# Patient Record
Sex: Male | Born: 1955 | Race: White | Hispanic: No | Smoking: Former smoker
Health system: Southern US, Community
[De-identification: ages and names within clinical notes are randomized; demographics above are authoritative.]

## PROBLEM LIST (undated history)

## (undated) DIAGNOSIS — D510 Vitamin B12 deficiency anemia due to intrinsic factor deficiency: Secondary | ICD-10-CM

## (undated) DIAGNOSIS — K5090 Crohn's disease, unspecified, without complications: Secondary | ICD-10-CM

## (undated) DIAGNOSIS — Z9989 Dependence on other enabling machines and devices: Principal | ICD-10-CM

## (undated) DIAGNOSIS — G4733 Obstructive sleep apnea (adult) (pediatric): Secondary | ICD-10-CM

## (undated) HISTORY — DX: Obstructive sleep apnea (adult) (pediatric): G47.33

## (undated) HISTORY — DX: Dependence on other enabling machines and devices: Z99.89

## (undated) HISTORY — DX: Vitamin B12 deficiency anemia due to intrinsic factor deficiency: D51.0

## (undated) HISTORY — DX: Crohn's disease, unspecified, without complications: K50.90

## (undated) HISTORY — PX: BOWEL RESECTION: SHX1257

---

## 1997-09-24 ENCOUNTER — Ambulatory Visit (HOSPITAL_COMMUNITY): Admission: RE | Admit: 1997-09-24 | Discharge: 1997-09-24 | Payer: Self-pay | Admitting: Gastroenterology

## 1997-12-22 ENCOUNTER — Encounter (HOSPITAL_COMMUNITY): Admission: RE | Admit: 1997-12-22 | Discharge: 1998-03-22 | Payer: Self-pay | Admitting: Gastroenterology

## 1998-07-06 ENCOUNTER — Ambulatory Visit (HOSPITAL_COMMUNITY): Admission: RE | Admit: 1998-07-06 | Discharge: 1998-07-06 | Payer: Self-pay | Admitting: Gastroenterology

## 1998-10-22 ENCOUNTER — Encounter (HOSPITAL_COMMUNITY): Admission: RE | Admit: 1998-10-22 | Discharge: 1999-01-20 | Payer: Self-pay | Admitting: Gastroenterology

## 1999-09-06 ENCOUNTER — Encounter: Admission: RE | Admit: 1999-09-06 | Discharge: 1999-09-06 | Payer: Self-pay | Admitting: Gastroenterology

## 1999-09-06 ENCOUNTER — Encounter: Payer: Self-pay | Admitting: Gastroenterology

## 1999-09-08 ENCOUNTER — Encounter (HOSPITAL_COMMUNITY): Admission: RE | Admit: 1999-09-08 | Discharge: 1999-12-07 | Payer: Self-pay | Admitting: Gastroenterology

## 1999-11-02 ENCOUNTER — Ambulatory Visit (HOSPITAL_COMMUNITY): Admission: RE | Admit: 1999-11-02 | Discharge: 1999-11-02 | Payer: Self-pay | Admitting: Gastroenterology

## 1999-11-02 ENCOUNTER — Encounter: Payer: Self-pay | Admitting: Gastroenterology

## 1999-11-24 ENCOUNTER — Ambulatory Visit: Admission: RE | Admit: 1999-11-24 | Discharge: 1999-11-24 | Payer: Self-pay | Admitting: Pulmonary Disease

## 1999-12-30 ENCOUNTER — Ambulatory Visit: Admission: RE | Admit: 1999-12-30 | Discharge: 1999-12-30 | Payer: Self-pay | Admitting: Pulmonary Disease

## 2001-05-14 ENCOUNTER — Encounter (INDEPENDENT_AMBULATORY_CARE_PROVIDER_SITE_OTHER): Payer: Self-pay | Admitting: *Deleted

## 2001-05-14 ENCOUNTER — Ambulatory Visit (HOSPITAL_COMMUNITY): Admission: RE | Admit: 2001-05-14 | Discharge: 2001-05-14 | Payer: Self-pay | Admitting: Gastroenterology

## 2003-06-10 ENCOUNTER — Encounter: Admission: RE | Admit: 2003-06-10 | Discharge: 2003-06-10 | Payer: Self-pay | Admitting: Gastroenterology

## 2008-05-22 ENCOUNTER — Observation Stay (HOSPITAL_COMMUNITY): Admission: EM | Admit: 2008-05-22 | Discharge: 2008-05-24 | Payer: Self-pay | Admitting: Emergency Medicine

## 2008-05-29 ENCOUNTER — Encounter: Admission: RE | Admit: 2008-05-29 | Discharge: 2008-05-29 | Payer: Self-pay | Admitting: Gastroenterology

## 2010-05-12 LAB — CBC
HCT: 35.2 % — ABNORMAL LOW (ref 39.0–52.0)
HCT: 36.5 % — ABNORMAL LOW (ref 39.0–52.0)
HCT: 41.1 % (ref 39.0–52.0)
Hemoglobin: 13.1 g/dL (ref 13.0–17.0)
Hemoglobin: 14.4 g/dL (ref 13.0–17.0)
MCHC: 35.9 g/dL (ref 30.0–36.0)
MCV: 100.2 fL — ABNORMAL HIGH (ref 78.0–100.0)
MCV: 99 fL (ref 78.0–100.0)
Platelets: 151 10*3/uL (ref 150–400)
Platelets: 157 10*3/uL (ref 150–400)
Platelets: 206 10*3/uL (ref 150–400)
RDW: 14.5 % (ref 11.5–15.5)
RDW: 14.7 % (ref 11.5–15.5)
RDW: 14.8 % (ref 11.5–15.5)
WBC: 5 10*3/uL (ref 4.0–10.5)
WBC: 5.6 10*3/uL (ref 4.0–10.5)
WBC: 8 10*3/uL (ref 4.0–10.5)

## 2010-05-12 LAB — BASIC METABOLIC PANEL
BUN: 6 mg/dL (ref 6–23)
BUN: 9 mg/dL (ref 6–23)
CO2: 27 mEq/L (ref 19–32)
Calcium: 8 mg/dL — ABNORMAL LOW (ref 8.4–10.5)
Creatinine, Ser: 0.82 mg/dL (ref 0.4–1.5)
GFR calc non Af Amer: >60 mL/min (ref >60)
GFR calc non Af Amer: >60 mL/min (ref >60)
Glucose, Bld: 108 mg/dL — ABNORMAL HIGH (ref 70–99)
Glucose, Bld: 108 mg/dL — ABNORMAL HIGH (ref 70–99)
Potassium: 3.7 mEq/L (ref 3.5–5.1)
Sodium: 136 mEq/L (ref 135–145)

## 2010-05-12 LAB — DIFFERENTIAL
Basophils Absolute: 0 10*3/uL (ref 0.0–0.1)
Basophils Relative: 0 % (ref 0–1)
Eosinophils Absolute: 0 10*3/uL (ref 0.0–0.7)
Eosinophils Relative: 0 % (ref 0–5)
Lymphocytes Relative: 6 % — ABNORMAL LOW (ref 12–46)
Lymphs Abs: 0.5 10*3/uL — ABNORMAL LOW (ref 0.7–4.0)
Monocytes Absolute: 0.6 10*3/uL (ref 0.1–1.0)
Monocytes Absolute: 0.7 10*3/uL (ref 0.1–1.0)
Monocytes Relative: 10 % (ref 3–12)
Monocytes Relative: 9 % (ref 3–12)
Neutro Abs: 4.5 10*3/uL (ref 1.7–7.7)
Neutrophils Relative %: 80 % — ABNORMAL HIGH (ref 43–77)

## 2010-05-12 LAB — COMPREHENSIVE METABOLIC PANEL
ALT: 18 U/L (ref 0–53)
AST: 27 U/L (ref 0–37)
Albumin: 3.4 g/dL — ABNORMAL LOW (ref 3.5–5.2)
Albumin: 3.6 g/dL (ref 3.5–5.2)
Alkaline Phosphatase: 75 U/L (ref 39–117)
BUN: 15 mg/dL (ref 6–23)
Calcium: 9 mg/dL (ref 8.4–10.5)
Chloride: 105 mEq/L (ref 96–112)
Chloride: 106 mEq/L (ref 96–112)
Creatinine, Ser: 0.93 mg/dL (ref 0.4–1.5)
GFR calc Af Amer: >60 mL/min (ref >60)
Glucose, Bld: 103 mg/dL — ABNORMAL HIGH (ref 70–99)
Potassium: 4 mEq/L (ref 3.5–5.1)
Sodium: 139 mEq/L (ref 135–145)
Total Bilirubin: 1 mg/dL (ref 0.3–1.2)
Total Protein: 6.5 g/dL (ref 6.0–8.3)

## 2010-05-12 LAB — PROTIME-INR
INR: 1 (ref 0.00–1.49)
Prothrombin Time: 12.9 seconds (ref 11.6–15.2)
Prothrombin Time: 13.7 seconds (ref 11.6–15.2)

## 2010-06-15 NOTE — Discharge Summary (Signed)
NAMECRIST, KRUSZKA          ACCOUNT NO.:  [id number]   MEDICAL RECORD NO.:  [id number]          PATIENT TYPE:  OBV   LOCATION:  5532                         FACILITY:  [facility]   PHYSICIAN:  John C. Madilyn Fireman, M.D.    DATE OF BIRTH:  [birth year]   DATE OF ADMISSION:  05/22/2008  DATE OF DISCHARGE:  05/24/2008                               DISCHARGE SUMMARY   ADMIT DIAGNOSES:  Abdominal pain, distention, vomiting.   DISCHARGE DIAGNOSES:  Ileus versus partial bowel obstruction, and the  rest of his discharge diagnoses were consistent with his past medical  history.  They include Crohn disease, history of bowel obstruction and  resection x2, history of pulmonary embolus x2 in 1976, chronic diarrhea,  anal stenosis, perirectal abscess that was drained in 2000.   CONSULTATIONS:  None.   PROCEDURES:  None.   PERTINENT RADIOLOGICAL EXAMINATION:  On May 23, 2008, the patient had  an abdominal x-ray that showed no high-grade obstruction, but he did  have potential mucosal thickening through the splenic flexure of the  colon and gaseous distention with no gas in his rectum.   PERTINENT LABORATORY DATA:  On the morning of discharge, his hemoglobin  was 12.5, hematocrit 35.2, white count 5.0, platelets 151,000.  His BMET  was within normal limits other than a potassium of 3.4.  on May 22, 2008, date of admission, he had an ESR of 10.   HISTORY AND BRIEF HOSPITAL COURSE:  Mr. Breden is a very pleasant 55-  year-old gentleman who has been on long-term patient of Dr. Molly Maduro  Buccini for many years.  He has a history of Crohn disease.  On the  evening of May 21, 2008, he had severe waves of abdominal pain,  bloating with urge to defecate without results that started after eating  dinner.  His pain became so severe that Dr. Matthias Hughs recommended he come  to the [facility] for admission and observation on May 22, 2008.  At  the time of admission, Mr. Clock told me that he normally  has 6-8  semisolid stools a day without any blood; however, he had had no stools  for the past 24 hours but did have the urge to defecate.  Interestingly,  he had a colonoscopy by Dr. Matthias Hughs just 6 weeks prior that was  completely normal with no changes of Crohn disease.  He was admitted,  placed on IV fluids, as well as sips and chips, given pain medication  and Imuran, which is his normal at home Crohn's maintenance medications.  He had an x-ray of his abdomen that showed no bowel obstruction.  With  conservative management, we were able to very slowly advance his diet.  On Friday, he told me that he was still having pain requiring pain  medications on a regular basis.  He had 1 small stool.  His bowel sounds  were distinctly tympanic, and his abdomen was still distended.  We kept  him overnight 1 more night, and he went home on Saturday morning,  feeling much improved after having multiple bowel movements.   He was discharged to home on  the following medications.  He was written  a prescription for prednisone 40 mg a day x7 days or until seen by Dr.  Matthias Hughs.  The rest of his medications were consistent with his  preadmission medications and include:  1. Hydrocortisone topical 2.5% cream.  2. Ambien 5 mg p.r.n. nightly.  3. Imuran 50 mg 4 tablets daily q.a.m.  4. Prilosec 20 mg 1 tablet daily q.a.m.  5. Imodium 4 tablets daily p.r.n. diarrhea.  6. Testosterone 5 mg patch change daily.  7. Welchol 625 mg 3 tablets daily.  8. Vitamin B12 injections twice a month.  9. Align probiotics tablets.  10.Calcium 1 tablet.  11.Potassium 20 mEq 1 tablet daily.  12.Multivitamin tablets 1 tablet daily.   DISCHARGE INSTRUCTIONS:  Advancing diet slowly.  To call our office with  any increase in pain, fever, or vomiting.  He was also instructed to  please see Dr. Matthias Hughs at the [facility] at 8:30 on Monday  morning.      Stephani Police, PA    ______________________________   Everardo All Madilyn Fireman, M.D.    MLY/MEDQ  D:  05/27/2008  T:  05/27/2008  Job:  [id number]   cc:   Bernette Redbird, M.D.

## 2010-06-15 NOTE — H&P (Signed)
NAMECAYLOR, Scott [name]          ACCOUNT NO.:  [id number]   MEDICAL RECORD NO.:  [id number]          PATIENT TYPE:  OBV   LOCATION:  5532                         FACILITY:  [facility]   PHYSICIAN:  John C. Madilyn Fireman, M.D.    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  He is also a long-term patient of Dr. Molly Maduro Buccini with a history of  Crohn disease requiring 2 resections for bowel obstructions in the past.   The patient reports symptoms of waves of pain, bloating, and the urge to  defecate without results that started last night after eating dinner  that consisted of a Reuben sandwich and onion ring.  His pain is  described as severe and this gentlemen is reported to be a man with a  high pain tolerance.  He tells me the pain comes in waves and is located  in the center of his abdomen.  The patient normally has 6-8 semisolid  stools a day without any blood.  Prior to last night his stools were  normal.  Last night, his stools became watery and light.  The patient  felt feverish with waves of pain.  He has had no recent illness.  Interestingly, he had a normal colonoscopy 2-6 weeks ago by Dr. Matthias Hughs.  He describes no arthralgias, erythematous nodules.  No changes in vision  or other apparent extraintestinal manifestations or problems.   PAST MEDICAL HISTORY:  Significant for Crohn disease, recurrent  obstructions prior to his right hemicolectomy with GI resection in 1976.  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  He has had no bowel obstruction or obstructive  symptoms since 1999.  He does have a history of pulmonary embolism x2  that was in 1976.  He was placed on Coumadin, but had GI bleeding with  it and no longer takes any anticoagulation.  He had a perirectal abscess  that was  drained in 2000.  He has got a history of chronic diarrhea that  has been controlled with Imodium.  He also has a history of anal  stenosis.   REVIEW OF SYSTEMS:  As per HPI also includes no recent weight loss.  No  dizziness.  No heart palpitations.   FAMILY HISTORY:  Negative for colon cancer and colon polyps.   SOCIAL HISTORY:  Negative for tobacco.  Negative for drugs.  He drinks  approximately 2 alcoholic beverages a week.  He is married.  He has 4  children.   PHYSICAL EXAMINATION:  GENERAL:  He is alert and oriented in no apparent  distress.  He appears well.  VITAL SIGNS:  Temperature 97, pulse 71, respirations 15, and blood  pressure is 94/58.  HEART:  Regular rate and rhythm with no obvious murmurs, rubs, or  gallops.  ABDOMEN:  Soft, tender on the right side and tender on the right lower  quadrant, quiet to auscultation.  LOWER EXTREMITY:  No edema.   LABORATORY DATA:  Pending.  X-ray is pending.   ASSESSMENT:  Crohn disease, possible bowel obstruction.  We will admit  for observation.  I gave IV fluids, pain control, probable radiologic  evaluation of his bowel.  Hopefully, he will improve with conservative  measures and had good resolution of his symptoms without surgery.      Stephani Police, PA    ______________________________  Everardo All Madilyn Fireman, M.D.    MLY/MEDQ  D:  05/22/2008  T:  05/23/2008  Job:  [id number]   cc:   Bernette Redbird, M.D.

## 2010-06-18 NOTE — Procedures (Signed)
[facility]  Patient:    Scott [name], Scott [name] Visit Number: [id number] MRN: [id number]          Service Type: END Location: ENDO Attending Physician:  Rich Brave Dictated by:   Florencia Reasons, M.D. Proc. Date: 05/14/01 Admit Date:  05/14/2001                             Procedure Report  PROCEDURE PERFORMED:  Colonoscopy with biopsies.  ENDOSCOPIST:  Florencia Reasons, M.D.  INDICATIONS FOR PROCEDURE:  The patient is a 55 year old gentleman with longstanding Crohns disease status post right hemicolectomy and terminal ileectomy, maintained on Anticort and Imuran, this is being done for dysplasia surveillance.  His last colonoscopy, approximately three years ago, showed no dysplasia.  FINDINGS:  Marked improvement in the inflammatory changes compared to three years ago.  DESCRIPTION OF PROCEDURE:  The nature, purpose and risks of the procedure were familiar to the patient from prior examination and he provided written consent.  The Olympus adjustable tension pediatric video colonoscope was advanced around the patients colonic remnant and for a moderate distance up into his neo-terminal ileum.  Note that digital exam demonstrated nodularity and some degree of anal stenosis.  I really was not able to feel the prostate gland well.  This examination was remarkably benign in its appearance compared to previously. His large rectal ulceration was essentially resolved.  There was just a little bit of shallow ulceration right at the ileocolonic anastomosis, in the very distal most portion of the neo-terminal ileum, and in the proximal portion of the colonic remnant.  This was very, very mild in amount.  No stricturing was identified, no polyps, masses, diverticulosis or vascular malformations were noted.  Retroflexion was not performed in the rectum. Biopsies were obtained from the anastomotic region where there was  some inflammatory change and then randomly throughout the remainder of the colon. The patient tolerated the procedure well and there were no apparent complications.  IMPRESSION: 1. Marked improvement Crohns disease activity. 2. Random biopsies obtained for dysplasia surveillance.  PLAN:  Await pathology. Dictated by:   Florencia Reasons, M.D. Attending Physician:  Rich Brave DD:  05/14/01 TD:  05/14/01 Job: [id number]

## 2012-12-25 ENCOUNTER — Other Ambulatory Visit (HOSPITAL_COMMUNITY)
Admission: RE | Admit: 2012-12-25 | Discharge: 2012-12-25 | Disposition: A | Discharge status: Home / Self Care | Payer: BC Managed Care – PPO | Source: Ambulatory Visit | Attending: Gastroenterology | Admitting: Gastroenterology

## 2012-12-25 ENCOUNTER — Other Ambulatory Visit: Payer: Self-pay | Admitting: Gastroenterology

## 2012-12-25 DIAGNOSIS — K5190 Ulcerative colitis, unspecified, without complications: Secondary | ICD-10-CM | POA: Insufficient documentation

## 2013-03-13 ENCOUNTER — Other Ambulatory Visit: Payer: Self-pay | Admitting: Dermatology

## 2013-04-05 ENCOUNTER — Ambulatory Visit (INDEPENDENT_AMBULATORY_CARE_PROVIDER_SITE_OTHER): Payer: BC Managed Care – PPO | Admitting: Neurology

## 2013-04-05 ENCOUNTER — Encounter: Payer: Self-pay | Admitting: Neurology

## 2013-04-05 VITALS — BP 137/82 | HR 75 | Temp 96.4°F | Ht 75.0 in | Wt 248.5 lb

## 2013-04-05 DIAGNOSIS — G4733 Obstructive sleep apnea (adult) (pediatric): Secondary | ICD-10-CM

## 2013-04-05 DIAGNOSIS — K5090 Crohn's disease, unspecified, without complications: Secondary | ICD-10-CM

## 2013-04-05 DIAGNOSIS — E669 Obesity, unspecified: Secondary | ICD-10-CM

## 2013-04-05 DIAGNOSIS — R351 Nocturia: Secondary | ICD-10-CM

## 2013-04-05 NOTE — Patient Instructions (Signed)

## 2013-04-05 NOTE — Progress Notes (Signed)
Subjective:    Patient ID: Scott [name] is a 58 y.o. male.  HPI   Scott Foley, MD, PhD [facility] [address], [address] [address] [place], [place] [address]  Dear Dr. Link Snuffer,   I saw your patient, Scott [name], upon your kind request, in my neurologic clinic today for initial consultation of his sleep disorder, in particular, concern for obstructive sleep apnea. The patient is unaccompanied today. As you know, [name], is a very pleasant 58 year old right-handed gentleman with an underlying medical history of hyperlipidemia, PE secondary to Crohn's disease (on Imuran), s/p multiple colon resection surgeries, chronic anemia, rosacea, vitamin D deficiency, low testosterone and obesity, who reports snoring and witnessed apneic pauses per wife's report and he often wakes up with nocturia. He has had frequent, nearly night leg cramps, which makes him wake up. He denies RLS Sx and is not known to kick in his sleep.   His typical bedtime is reported to be around MN or 1 AM and usual wake time is around 6:30 or 7 AM. Sleep onset typically occurs within a few minutes. He reports feeling poorly rested upon awakening. He wakes up on an average 4 times in the middle of the night and has to go to the bathroom 4 times on a typical night. He admits to frequent morning headaches.  He reports excessive daytime somnolence (EDS) and His Epworth Sleepiness Score (ESS) is 22/24 today. He has fallen asleep while driving, as in dozing off, at times, enough to veer out of his lane. He never had a MVA. The patient has not been taking a planned nap and works as an Pensions consultant. He was drinking a lot of caffeine in the past, up to 6 cups of coffee, but now only one cup of caffeinated coffee in the morning, and quit smoking 09/16/78, on his wedding day. He drinks alcohol occasionally.  He has been known to snore for the past many years. Snoring is reportedly moderate, and associated  with choking sounds and witnessed apneas. The patient denies a sense of choking or strangling feeling. There is no report of nighttime reflux, with rare nighttime cough experienced. The patient has not noted any RLS symptoms and is not known to kick while asleep or before falling asleep. There is family history of OSA in his father, who lived to be 4 and died of prostate and bladder cancer. Mother died of lung cancer. One brother died in a shooting accident.  He is a restless sleeper and in the morning, the bed is quite disheveled.   He denies cataplexy, sleep paralysis, hypnagogic or hypnopompic hallucinations, or sleep attacks. He does not report any vivid dreams, nightmares, dream enactments, or parasomnias, such as sleep talking or sleep walking. The patient has not had a sleep study or a home sleep test. His bedroom is usually dark and cool. There is a TV in the bedroom and usually it is not on at night.   His Past Medical History Is Significant For: Past Medical History  Diagnosis Date  . Crohn's disease   . Pernicious anemia     His Past Surgical History Is Significant For: Past Surgical History  Procedure Laterality Date  . Bowel resection      2    His Family History Is Significant For: History reviewed. No pertinent family history.  His Social History Is Significant For: History   Social History  . Marital Status: Married    Spouse Name: N/A  Number of Children: N/A  . Years of Education: N/A   Social History Main Topics  . Smoking status: None  . Smokeless tobacco: None  . Alcohol Use: None  . Drug Use: None  . Sexual Activity: None   Other Topics Concern  . None   Social History Narrative  . None    His Allergies Are:  No Known Allergies:   His Current Medications Are:  No outpatient encounter prescriptions on file as of 04/05/2013.   Review of Systems:  Out of a complete 14 point review of systems, all are reviewed and negative with the exception of  these symptoms as listed below:   Review of Systems  Respiratory:       Snoring  Gastrointestinal: Positive for diarrhea.  Neurological:       Sleepiness,snoring  Hematological:       Anemia    Objective:  Neurologic Exam  Physical Exam Physical Examination:   Filed Vitals:   04/05/13 0934  BP: 137/82  Pulse: 75  Temp: 96.4 F (35.8 C)    General Examination: The patient is a very pleasant 58 y.o. male in no acute distress. He appears well-developed and well-nourished and very well groomed.   HEENT: Normocephalic, atraumatic, pupils are equal, round and reactive to light and accommodation. Funduscopic exam is normal with sharp disc margins noted. Extraocular tracking is good without limitation to gaze excursion or nystagmus noted. Normal smooth pursuit is noted. Hearing is grossly intact. Tympanic membranes are clear on the R and obscured with cerumen on the L. Face is symmetric with normal facial animation and normal facial sensation. Speech is clear with no dysarthria noted. There is no hypophonia. There is no lip, neck/head, jaw or voice tremor. Neck is supple with full range of passive and active motion. There are no carotid bruits on auscultation. Oropharynx exam reveals: mild mouth dryness, adequate dental hygiene and mild to perhaps moderate airway crowding, due to larger tongue and longer uvula with soft palate redundancy. Mallampati is class II. Tongue protrudes centrally and palate elevates symmetrically. Tonsils are 1+ in size. Neck size is 17 7/8 inches.   Chest: Clear to auscultation without wheezing, rhonchi or crackles noted.  Heart: S1+S2+0, regular and normal without murmurs, rubs or gallops noted.   Abdomen: Soft, non-tender and non-distended with normal bowel sounds appreciated on auscultation.  Extremities: There is no pitting edema in the distal lower extremities bilaterally. Pedal pulses are intact.  Skin: Warm and dry without trophic changes noted. There  are no varicose veins.  Musculoskeletal: exam reveals no obvious joint deformities, tenderness or joint swelling or erythema.   Neurologically:  Mental status: The patient is awake, alert and oriented in all 4 spheres. His immediate and remote memory, attention, language skills and fund of knowledge are appropriate. There is no evidence of aphasia, agnosia, apraxia or anomia. Speech is clear with normal prosody and enunciation. Thought process is linear. Mood is normal and affect is normal.  Cranial nerves II - XII are as described above under HEENT exam. In addition: shoulder shrug is normal with equal shoulder height noted. Motor exam: Normal bulk, strength and tone is noted. There is no drift, tremor or rebound. Romberg is negative. Reflexes are 1+ in the UEs and 3+ in the knees and trace in the ankles. Babinski: Toes are flexor bilaterally. Fine motor skills and coordination: intact with normal finger taps, normal hand movements, normal rapid alternating patting, normal foot taps and normal foot agility.  Cerebellar  testing: No dysmetria or intention tremor on finger to nose testing. Heel to shin is unremarkable bilaterally. There is no truncal or gait ataxia.  Sensory exam: intact to light touch, pinprick, vibration, temperature sense and proprioception in the upper and lower extremities.  Gait, station and balance: He stands easily. No veering to one side is noted. No leaning to one side is noted. Posture is age-appropriate and stance is narrow based. Gait shows normal stride length and normal pace. No problems turning are noted. He turns en bloc. Tandem walk is unremarkable. Intact toe and heel stance is noted.               Assessment and Plan:   In summary, CHRISTAPHER GILLIAN is a very pleasant 58 y.o.-year old male with an underlying medical history of hyperlipidemia, PE secondary to Crohn's disease (on Imuran), s/p multiple colon resection surgeries, chronic anemia, rosacea, vitamin D  deficiency, low testosterone, nocturia and obesity, whose history and physical exam are indeed concerning for obstructive sleep apnea (OSA). I had a long chat with the patient about my findings and the diagnosis, its prognosis and treatment options. We talked about medical treatments and non-pharmacological approaches. I explained in particular the risks and ramifications of untreated moderate to severe OSA, especially with respect to developing cardiovascular disease down the Road, including congestive heart failure, difficult to treat hypertension, cardiac arrhythmias, or stroke. Even type 2 diabetes has in part been linked to untreated OSA. We talked about trying to maintain a healthy lifestyle in general, as well as the importance of weight control. I encouraged the patient to eat healthy, exercise daily and keep well hydrated, to keep a scheduled bedtime and wake time routine, to not skip any meals and eat healthy snacks in between meals.  I recommended the following at this time: sleep study with potential positive airway pressure titration.  I explained the sleep test procedure to the patient and also outlined possible surgical and non-surgical treatment options of OSA, including the use of a custom-made dental device, upper airway surgical options, such as pillar implants, radiofrequency surgery, tongue base surgery, and UPPP. I also explained the CPAP treatment option to the patient, who indicated that he would be willing to try CPAP if the need arises. I explained the importance of being compliant with PAP treatment, not only for insurance purposes but primarily to improve His symptoms, and for the patient's long term health benefit, including to reduce His cardiovascular risks. I answered all his questions today and the patient was in agreement. I would like to see him back after the sleep study is completed and encouraged him to call with any interim questions, concerns, problems or updates.    Thank you very much for allowing me to participate in the care of this nice patient. If I can be of any further assistance to you please do not hesitate to call me at [phone].  Sincerely,   Scott Foley, MD, PhD

## 2013-05-17 ENCOUNTER — Ambulatory Visit (INDEPENDENT_AMBULATORY_CARE_PROVIDER_SITE_OTHER): Payer: BC Managed Care – PPO

## 2013-05-17 DIAGNOSIS — R351 Nocturia: Secondary | ICD-10-CM

## 2013-05-17 DIAGNOSIS — G4733 Obstructive sleep apnea (adult) (pediatric): Secondary | ICD-10-CM

## 2013-05-17 DIAGNOSIS — G4710 Hypersomnia, unspecified: Secondary | ICD-10-CM

## 2013-05-17 DIAGNOSIS — G4761 Periodic limb movement disorder: Secondary | ICD-10-CM

## 2013-05-17 DIAGNOSIS — G4730 Sleep apnea, unspecified: Secondary | ICD-10-CM

## 2013-05-17 DIAGNOSIS — K5090 Crohn's disease, unspecified, without complications: Secondary | ICD-10-CM

## 2013-06-07 ENCOUNTER — Encounter: Payer: Self-pay | Admitting: *Deleted

## 2013-06-07 ENCOUNTER — Telehealth: Payer: Self-pay | Admitting: Neurology

## 2013-06-07 DIAGNOSIS — G4733 Obstructive sleep apnea (adult) (pediatric): Secondary | ICD-10-CM

## 2013-06-07 NOTE — Telephone Encounter (Signed)
Please call and notify patient that the recent sleep study confirmed the diagnosis of OSA. He did very well with CPAP during the study with significant improvement of the respiratory events. Therefore, I would like start the patient on CPAP at home. I placed the order in the chart.   Arrange for CPAP set up at home through a DME company of patient's choice and fax/route report to PCP and referring MD (if other than PCP).   The patient will also need a follow up appointment with me in 6-8 weeks post set up that has to be scheduled; help the patient schedule this (in a follow-up slot).   Please re-enforce the importance of compliance with treatment and the need for us to monitor compliance data.   Once you have spoken to the patient and scheduled the return appointment, you may close this encounter, thanks,   Calloway Andrus, MD, PhD [facility]    

## 2013-06-07 NOTE — Telephone Encounter (Signed)
I called and spoke with the patient about his recent sleep study results. I informed the patient that the study did confirm the diagnosis of obstructive sleep apnea and that he did well on CPAP during the night of his study. Dr. Frances Furbish recommend CPAP therapy at home, so I will send the order to [facility] who will contact the patient. I will fax a copy of the report to Dr. Alphonsus Sias office and mail a copy to the patient along with a follow up instruction letter.

## 2013-07-19 ENCOUNTER — Encounter: Payer: Self-pay | Admitting: Neurology

## 2013-07-24 NOTE — Progress Notes (Signed)
Quick Note:  I reviewed the patient's CPAP compliance data from 06/26/2013 to 07/18/2013, which is a total of 23 days, during which time the patient used CPAP every day. The average usage for all days was 5 hours and 31 minutes. The percent used days greater than 4 hours was 78 %, indicating good compliance. The residual AHI was 1.2 per hour, indicating an appropriate treatment pressure of 8 cwp with EPR of 3. Air leak from the mask was low at 8 L per minute at the 95th percentile. I will review this data with the patient at the next office visit, which is currently routinely scheduled for 07/31/2013 at 8:30 AM, provide feedback and additional troubleshooting if need be.  Huston FoleySaima Athar, MD, PhD [facility]   ______

## 2013-07-31 ENCOUNTER — Encounter (INDEPENDENT_AMBULATORY_CARE_PROVIDER_SITE_OTHER): Payer: Self-pay

## 2013-07-31 ENCOUNTER — Encounter: Payer: Self-pay | Admitting: Neurology

## 2013-07-31 ENCOUNTER — Ambulatory Visit (INDEPENDENT_AMBULATORY_CARE_PROVIDER_SITE_OTHER): Payer: BC Managed Care – PPO | Admitting: Neurology

## 2013-07-31 VITALS — BP 132/90 | HR 84 | Temp 96.1°F | Ht 75.0 in | Wt 242.0 lb

## 2013-07-31 DIAGNOSIS — R351 Nocturia: Secondary | ICD-10-CM

## 2013-07-31 DIAGNOSIS — E669 Obesity, unspecified: Secondary | ICD-10-CM

## 2013-07-31 DIAGNOSIS — Z9989 Dependence on other enabling machines and devices: Secondary | ICD-10-CM

## 2013-07-31 DIAGNOSIS — G4733 Obstructive sleep apnea (adult) (pediatric): Secondary | ICD-10-CM

## 2013-07-31 DIAGNOSIS — K5090 Crohn's disease, unspecified, without complications: Secondary | ICD-10-CM

## 2013-07-31 HISTORY — DX: Obstructive sleep apnea (adult) (pediatric): G47.33

## 2013-07-31 HISTORY — DX: Obstructive sleep apnea (adult) (pediatric): Z99.89

## 2013-07-31 NOTE — Patient Instructions (Signed)

## 2013-07-31 NOTE — Progress Notes (Signed)
Subjective:    Patient ID: ZOHAIR EPP is a 58 y.o. male.  HPI    Interim history:   Mr. Ketchum, is a very pleasant 58 year old right-handed gentleman with an underlying medical history of hyperlipidemia, PE secondary to Crohn's disease (on Imuran), s/p multiple colon resection surgeries, chronic anemia, rosacea, vitamin D deficiency, low testosterone and obesity, who presents for followup consultation of his obstructive sleep apnea after his recent sleep study. He is unaccompanied today. I first met him on 04/05/2013 at the request of his primary care physician, at which time he reported a long-standing history of snoring and witnessed apneas as well as nocturia, AM HAs and significant daytime somnolence. I suggested he return for sleep study. He had a split-night sleep study on 05/17/2013 and I went over his test results with him in detail today. His baseline sleep efficiency was mildly reduced at 82.8% with a latency to sleep of 6 minutes and wake after sleep onset of 22 minutes with mild sleep fragmentation noted. He had a mildly elevated arousal index. He had an increased percentage of stage I and stage II sleep, a absence of slow-wave sleep and 15.6% of REM sleep with a mildly prolonged REM latency. He had moderate to loud snoring. He had 83 obstructive hypopneas resulting in an AHI of 37 per hour. Face on oxygen saturation was 91% with a nadir of 71% in supine REM sleep. He was then titrated on CPAP. Sleep efficiency was 85.6% during the treatment portion of the study. Arousal index was improved. He had an increased percentage of stage II sleep, absence of slow-wave sleep and an increased percentage of REM sleep suggesting REM rebound. The average oxygen saturation was improved at 93%. Nadir was 86%. Time below 88% saturation was 54 seconds only. He was noted to have severe periodic leg movements of sleep during the treatment portion of the study with mild arousals at 4.7 per hour. He  was titrated on CPAP from 5-8 cm with his AHI reduced to 0 events per hour at the final pressure with supine REM sleep achieved. I reviewed the patient's CPAP compliance data from 06/26/2013 to 07/18/2013, which is a total of 23 days, during which time the patient used CPAP every day. The average usage for all days was 5 hours and 31 minutes. The percent used days greater than 4 hours was 78 %, indicating good compliance. The residual AHI was 1.2 per hour, indicating an appropriate treatment pressure of 8 cwp with EPR of 3. Air leak from the mask was low at 8 L per minute at the 95th percentile.  Today, I reviewed his compliance data from 06/26/2013 to 07/30/2013, a total of 35 days during which time he uses CPAP every night. Percent used days greater than 4 hours was 80%, indicating good compliance. His average usage was 5 hours and 28 minutes, residual AHI low at 1.1, leak low at 7.3, pressure of the segment 8 with EPR of 3.  Today, he reports  doing well. He has adjusted well to the mask and the pressure. Humidifier is at 4 and he denies any significant mouth dryness. He sleeps better. His wife has noted that he sleeps more soundly. He feels better during the day. He and his wife took a trip recently and he took his machine with him on air travel. He has no new complaints. His nocturia is a little better. Overall, he feels much better than before treatment. He has had no recent changes to his  medications or her medical history.  Her Past Medical History Is Significant For: Past Medical History  Diagnosis Date  . Crohn's disease   . Pernicious anemia   . OSA on CPAP 07/31/2013    Her Past Surgical History Is Significant For: Past Surgical History  Procedure Laterality Date  . Bowel resection      2    Her Family History Is Significant For: Family History  Problem Relation Age of Onset  . Cancer Mother   . Cancer Father   . Heart failure Maternal Grandmother   . Cancer Maternal Grandfather    . Sleep apnea Father   . Cancer Paternal Grandmother   . Cancer Paternal Grandfather   . COPD Brother     Her Social History Is Significant For: History   Social History  . Marital Status: Married    Spouse Name: N/A    Number of Children: N/A  . Years of Education: N/A   Social History Main Topics  . Smoking status: Former Smoker    Quit date: 09/16/1978  . Smokeless tobacco: None  . Alcohol Use: 1.5 oz/week    3 drink(s) per week  . Drug Use: No  . Sexual Activity: None   Other Topics Concern  . None   Social History Narrative  . None    Her Allergies Are:  No Known Allergies:   Her Current Medications Are:  Outpatient Encounter Prescriptions as of 07/31/2013  Medication Sig  . azaTHIOprine (IMURAN) 50 MG tablet Take 200 mg by mouth daily.  Marland Kitchen CALCIUM PO Take 2 tablets by mouth daily.  . Cholecalciferol (VITAMIN D3) 2000 UNITS TABS Take 3 tablets by mouth daily.  . colesevelam (WELCHOL) 625 MG tablet Take 1,875 mg by mouth daily.  . Cyanocobalamin (VITAMIN B 12) 100 MCG LOZG Place 1 tablet under the tongue daily.  . hydrocortisone 2.5 % cream Apply 1 application topically daily.  . Loperamide HCl (IMODIUM PO) Take 4 tablets by mouth 4 (four) times daily.  . Multiple Vitamin (MULTIVITAMIN) capsule Take 1 capsule by mouth daily.  Marland Kitchen omeprazole (PRILOSEC) 10 MG capsule Take 10 mg by mouth daily.  Marland Kitchen POTASSIUM PO Take 2 tablets by mouth daily.  . Probiotic Product (ALIGN) 4 MG CAPS Take 1 capsule by mouth daily.  . Testosterone (ANDROGEL) 20.25 MG/1.25GM (1.62%) GEL Place 1 application onto the skin daily.  :  Review of Systems:  Out of a complete 14 point review of systems, all are reviewed and negative with the exception of these symptoms as listed below:   Review of Systems  Constitutional: Negative.   HENT: Negative.   Eyes: Negative.   Respiratory: Negative.   Cardiovascular: Negative.   Gastrointestinal: Positive for diarrhea.  Endocrine: Negative.    Genitourinary: Negative.   Musculoskeletal: Negative.   Skin: Negative.   Allergic/Immunologic: Negative.   Neurological: Negative.   Hematological:       Anemia  Psychiatric/Behavioral: Negative.     Objective:  Neurologic Exam  Physical Exam Physical Examination:   Filed Vitals:   07/31/13 0843  BP: 132/90  Pulse: 84  Temp: 96.1 F (35.6 C)    General Examination: The patient is a very pleasant 58 y.o. male in no acute distress. He appears well-developed and well-nourished and very well groomed.   HEENT: Normocephalic, atraumatic, pupils are equal, round and reactive to light and accommodation. Funduscopic exam is normal with sharp disc margins noted. Extraocular tracking is good without limitation to gaze excursion or nystagmus  noted. Normal smooth pursuit is noted. Hearing is grossly intact. Face is symmetric with normal facial animation and normal facial sensation. Speech is clear with no dysarthria noted. There is no hypophonia. There is no lip, neck/head, jaw or voice tremor. Neck is supple with full range of passive and active motion. There are no carotid bruits on auscultation. Oropharynx exam reveals: mild mouth dryness, adequate dental hygiene and mild to perhaps moderate airway crowding, due to larger tongue and longer uvula with soft palate redundancy. Mallampati is class II. Tongue protrudes centrally and palate elevates symmetrically. Tonsils are 1+ in size.   Chest: Clear to auscultation without wheezing, rhonchi or crackles noted.  Heart: S1+S2+0, regular and normal without murmurs, rubs or gallops noted.   Abdomen: Soft, non-tender and non-distended with normal bowel sounds appreciated on auscultation.  Extremities: There is no pitting edema in the distal lower extremities bilaterally. Pedal pulses are intact.  Skin: Warm and dry without trophic changes noted. There are no varicose veins.  Musculoskeletal: exam reveals no obvious joint deformities, tenderness  or joint swelling or erythema.   Neurologically:  Mental status: The patient is awake, alert and oriented in all 4 spheres. His immediate and remote memory, attention, language skills and fund of knowledge are appropriate. There is no evidence of aphasia, agnosia, apraxia or anomia. Speech is clear with normal prosody and enunciation. Thought process is linear. Mood is normal and affect is normal.  Cranial nerves II - XII are as described above under HEENT exam. In addition: shoulder shrug is normal with equal shoulder height noted. Motor exam: Normal bulk, strength and tone is noted. There is no drift, tremor or rebound. Romberg is negative. Reflexes are 1+ in the UEs and 3+ in the knees and trace in the ankles. Fine motor skills and coordination: intact with normal finger taps, normal hand movements, normal rapid alternating patting, normal foot taps and normal foot agility.  Cerebellar testing: No dysmetria or intention tremor on finger to nose testing. Heel to shin is unremarkable bilaterally. There is no truncal or gait ataxia.  Sensory exam: intact to light touch, pinprick, vibration, temperature sense in the upper and lower extremities.  Gait, station and balance: He stands easily. No veering to one side is noted. No leaning to one side is noted. Posture is age-appropriate and stance is narrow based. Gait shows normal stride length and normal pace. No problems turning are noted. He turns en bloc. Tandem walk is unremarkable. Intact toe and heel stance is noted.               Assessment and Plan:   In summary, OLAWALE MARNEY is a very pleasant 58 y.o.-year old male  with an underlying medical history of hyperlipidemia, PE secondary to Crohn's disease (on Imuran), s/p multiple colon resection surgeries, chronic anemia, rosacea, vitamin D deficiency, low testosterone and obesity, who presents for followup consultation of his obstructive sleep apnea after his recent sleep study. He has severe  sleep apnea and is well treated with CPAP, indicating good tolerance of the mask and pressure. I talked to him about his sleep test results in detail today and explained the findings. I answered all his questions. I also explained the CPAP compliance data to him and congratulated him on his good compliance and encouraged him to continue using CPAP not only to continue to help his symptoms but also to overall reduce his cardiovascular risk long term. He is very pleased with how he's doing. Physical exam is  stable and since he is doing well I suggested he followup with me in 6 months routinely and yearly thereafter hopefully if he continues to do well. I answered all his questions today and he was in agreement. Most of our 30 minute visit was spent in counseling and coordination of care and in reviewing test results and data.

## 2014-01-21 ENCOUNTER — Telehealth: Payer: Self-pay | Admitting: Neurology

## 2014-01-21 NOTE — Telephone Encounter (Signed)
Called and resched patient to 02/06/14 due to template change , confirmed with wife

## 2014-02-03 ENCOUNTER — Ambulatory Visit: Payer: BC Managed Care – PPO | Admitting: Neurology

## 2014-02-06 ENCOUNTER — Encounter: Payer: Self-pay | Admitting: Neurology

## 2014-02-06 ENCOUNTER — Ambulatory Visit (INDEPENDENT_AMBULATORY_CARE_PROVIDER_SITE_OTHER): Payer: BLUE CROSS/BLUE SHIELD | Admitting: Neurology

## 2014-02-06 VITALS — BP 121/81 | HR 63 | Temp 97.0°F | Resp 14 | Ht 63.0 in | Wt 252.0 lb

## 2014-02-06 DIAGNOSIS — K5090 Crohn's disease, unspecified, without complications: Secondary | ICD-10-CM

## 2014-02-06 DIAGNOSIS — Z9989 Dependence on other enabling machines and devices: Secondary | ICD-10-CM

## 2014-02-06 DIAGNOSIS — G4733 Obstructive sleep apnea (adult) (pediatric): Secondary | ICD-10-CM

## 2014-02-06 NOTE — Progress Notes (Signed)
Subjective:    Patient ID: Scott [name] is a 59 y.o. male.  HPI     Interim history:   Scott [name], is a very pleasant 59 year old right-handed gentleman with an underlying medical history of hyperlipidemia, PE secondary to Crohn's disease (on Imuran), s/p multiple colon resection surgeries, chronic anemia, rosacea, vitamin D deficiency, low testosterone and obesity, who presents for followup consultation of his obstructive sleep apnea, on treatment with CPAP. The patient is unaccompanied today. I last saw him on 07/31/2013, at which time he reported doing well. He had adjusted well to the mask and the pressure. He reported sleeping better with CPAP. He felt less tired during the day. His nocturia was improved somewhat as well. Overall, he felt much better than before treatment.   Today, I reviewed his compliance data from 12/30/2013 through 01/28/2014 which is a total of 30 days during which time he used his machine every night, percent used days greater than 4 hours was 83%, indicating very good compliance with an average usage of 5 hours and 7 minutes for all nights. Residual AHI was low at 0.7 per hour and leak was low at 5.5 L/m for the 95th percentile. Pressure at 8 cm with EPR of 3.  Today, he reports doing well, no recent illness, no changes in medications. He works full-time as an Forensic psychologist. He has long hours sometimes. He does not sleep a whole lot at times. He says his wife also complaints that he does not sleep enough. When he sleeps he uses his CPAP every time. He is pleased with how he's doing. He received new supplies within the last 3 months.  I first met him on 04/05/2013 at the request of his primary care physician, at which time he reported a long-standing history of snoring and witnessed apneas as well as nocturia, AM HAs and significant daytime somnolence. I suggested he return for sleep study. He had a split-night sleep study on 05/17/2013 and I went over his test  results with him in detail today. His baseline sleep efficiency was mildly reduced at 82.8% with a latency to sleep of 6 minutes and wake after sleep onset of 22 minutes with mild sleep fragmentation noted. He had a mildly elevated arousal index. He had an increased percentage of stage I and stage II sleep, a absence of slow-wave sleep and 15.6% of REM sleep with a mildly prolonged REM latency. He had moderate to loud snoring. He had 83 obstructive hypopneas resulting in an AHI of 37 per hour. Face on oxygen saturation was 91% with a nadir of 71% in supine REM sleep. He was then titrated on CPAP. Sleep efficiency was 85.6% during the treatment portion of the study. Arousal index was improved. He had an increased percentage of stage II sleep, absence of slow-wave sleep and an increased percentage of REM sleep suggesting REM rebound. The average oxygen saturation was improved at 93%. Nadir was 86%. Time below 88% saturation was 54 seconds only. He was noted to have severe periodic leg movements of sleep during the treatment portion of the study with mild arousals at 4.7 per hour. He was titrated on CPAP from 5-8 cm with his AHI reduced to 0 events per hour at the final pressure with supine REM sleep achieved.  I reviewed the patient's CPAP compliance data from 06/26/2013 to 07/18/2013, which is a total of 23 days, during which time the patient used CPAP every day. The average usage for all days was 5 hours and  31 minutes. The percent used days greater than 4 hours was 78 %, indicating good compliance. The residual AHI was 1.2 per hour, indicating an appropriate treatment pressure of 8 cwp with EPR of 3. Air leak from the mask was low at 8 L per minute at the 95th percentile.  I reviewed his compliance data from 06/26/2013 to 07/30/2013, a total of 35 days during which time he uses CPAP every night. Percent used days greater than 4 hours was 80%, indicating good compliance. His average usage was 5 hours and 28  minutes, residual AHI low at 1.1, leak low at 7.3, pressure of the segment 8 with EPR of 3.  His Past Medical History Is Significant For: Past Medical History  Diagnosis Date  . Crohn's disease   . Pernicious anemia   . OSA on CPAP 07/31/2013    His Past Surgical History Is Significant For: Past Surgical History  Procedure Laterality Date  . Bowel resection      2    His Family History Is Significant For: Family History  Problem Relation Age of Onset  . Cancer Mother   . Cancer Father   . Heart failure Maternal Grandmother   . Cancer Maternal Grandfather   . Sleep apnea Father   . Cancer Paternal Grandmother   . Cancer Paternal Grandfather   . COPD Brother     His Social History Is Significant For: History   Social History  . Marital Status: Married    Spouse Name: Scott [name]    Number of Children: 4  . Years of Education: 19   Occupational History  .      Attorney   Social History Main Topics  . Smoking status: Former Smoker    Quit date: 09/16/1978  . Smokeless tobacco: Never Used  . Alcohol Use: 1.8 oz/week    3 Not specified per week     Comment: occas.  . Drug Use: No  . Sexual Activity: None   Other Topics Concern  . None   Social History Narrative   Patient consumes 1-2 cups caffeine daily    His Allergies Are:  No Known Allergies:   His Current Medications Are:  Outpatient Encounter Prescriptions as of 02/06/2014  Medication Sig  . azaTHIOprine (IMURAN) 50 MG tablet Take 200 mg by mouth daily.  Marland Kitchen CALCIUM PO Take 2 tablets by mouth daily.  . Cholecalciferol (VITAMIN D3) 2000 UNITS TABS Take 3 tablets by mouth daily. Twice daily  . colesevelam (WELCHOL) 625 MG tablet Take 1,875 mg by mouth daily.  . Cyanocobalamin (VITAMIN B 12) 100 MCG LOZG Place 1 tablet under the tongue daily.  . hydrocortisone 2.5 % cream Apply 1 application topically daily.  . Loperamide HCl (IMODIUM PO) Take 4 tablets by mouth 4 (four) times daily.  . Multiple Vitamin  (MULTIVITAMIN) capsule Take 1 capsule by mouth daily.  Marland Kitchen omeprazole (PRILOSEC) 10 MG capsule Take 10 mg by mouth daily. Twice daily  . POTASSIUM PO Take 2 tablets by mouth daily. 1/2 tablet by mouth twice daily  . Probiotic Product (ALIGN) 4 MG CAPS Take 1 capsule by mouth daily.  . Testosterone (ANDROGEL) 20.25 MG/1.25GM (1.62%) GEL Place 1 application onto the skin daily.  :  Review of Systems:  Out of a complete 14 point review of systems, all are reviewed and negative with the exception of these symptoms as listed below:   Review of Systems  All other systems reviewed and are negative.   Objective:  Neurologic  Exam  Physical Exam Physical Examination:   Filed Vitals:   02/06/14 0854  BP: 121/81  Pulse: 63  Temp: 97 F (36.1 C)  Resp: 14    General Examination: The patient is a very pleasant 59 y.o. male in no acute distress. He appears well-developed and well-nourished and very well groomed.   HEENT: Normocephalic, atraumatic, pupils are equal, round and reactive to light and accommodation. Funduscopic exam is normal with sharp disc margins noted. Extraocular tracking is good without limitation to gaze excursion or nystagmus noted. Normal smooth pursuit is noted. Hearing is grossly intact. Face is symmetric with normal facial animation and normal facial sensation. Speech is clear with no dysarthria noted. There is no hypophonia. There is no lip, neck/head, jaw or voice tremor. Neck is supple with full range of passive and active motion. There are no carotid bruits on auscultation. Oropharynx exam reveals: mild mouth dryness, adequate dental hygiene and mild to perhaps moderate airway crowding, due to larger tongue and longer uvula with soft palate redundancy. Mallampati is class II. Tongue protrudes centrally and palate elevates symmetrically. Tonsils are 1+ in size.   Chest: Clear to auscultation without wheezing, rhonchi or crackles noted.  Heart: S1+S2+0, regular and normal  without murmurs, rubs or gallops noted.   Abdomen: Soft, non-tender and non-distended with normal bowel sounds appreciated on auscultation.  Extremities: There is no pitting edema in the distal lower extremities bilaterally. Pedal pulses are intact.  Skin: Warm and dry without trophic changes noted. There are no varicose veins.  Musculoskeletal: exam reveals no obvious joint deformities, tenderness or joint swelling or erythema.   Neurologically:  Mental status: The patient is awake, alert and oriented in all 4 spheres. His immediate and remote memory, attention, language skills and fund of knowledge are appropriate. There is no evidence of aphasia, agnosia, apraxia or anomia. Speech is clear with normal prosody and enunciation. Thought process is linear. Mood is normal and affect is normal.  Cranial nerves II - XII are as described above under HEENT exam. In addition: shoulder shrug is normal with equal shoulder height noted. Motor exam: Normal bulk, strength and tone is noted. There is no drift, tremor or rebound. Romberg is negative. Reflexes are 1+ in the UEs and 3+ in the knees and trace in the ankles. Fine motor skills and coordination: intact with normal finger taps, normal hand movements, normal rapid alternating patting, normal foot taps and normal foot agility.  Cerebellar testing: No dysmetria or intention tremor on finger to nose testing. Heel to shin is unremarkable bilaterally. There is no truncal or gait ataxia.  Sensory exam: intact to light touch, vibration, temperature sense in the upper and lower extremities.  Gait, station and balance: He stands easily. No veering to one side is noted. No leaning to one side is noted. Posture is age-appropriate and stance is narrow based. Gait shows normal stride length and normal pace. No problems turning are noted. He turns en bloc. Tandem walk is unremarkable.            Assessment and Plan:   In summary, Scott [name] is a very  pleasant 59 year old male with an underlying medical history of hyperlipidemia, PE secondary to Crohn's disease (on Imuran), s/p multiple colon resection surgeries, chronic anemia, rosacea, vitamin D deficiency, low testosterone and obesity, who presents for followup consultation of his obstructive sleep apnea. He has severe sleep apnea and is well treated with CPAP, indicating good tolerance of the mask and pressure  and full compliance. I again talked to him about his sleep test results and his most recent compliance data. I congratulated him on his good compliance and encouraged him to continue using CPAP  every time he sleeps, not only to continue to help his symptoms but also to overall reduce his cardiovascular risk long term.  he is advised to try to sleep with a regular schedule and try to achieve more total sleep time. Overall, he has done well and he is pleased with how he is doing. His physical exam is stable and since he is doing well I suggested he followup with me in 12 months routinely. I answered all his questions today and he was in agreement.

## 2014-02-06 NOTE — Patient Instructions (Signed)

## 2014-02-19 ENCOUNTER — Encounter: Payer: Self-pay | Admitting: Neurology

## 2014-10-09 ENCOUNTER — Encounter: Payer: Self-pay | Admitting: Podiatry

## 2014-10-09 ENCOUNTER — Ambulatory Visit (INDEPENDENT_AMBULATORY_CARE_PROVIDER_SITE_OTHER): Payer: BLUE CROSS/BLUE SHIELD

## 2014-10-09 ENCOUNTER — Ambulatory Visit (INDEPENDENT_AMBULATORY_CARE_PROVIDER_SITE_OTHER): Payer: BLUE CROSS/BLUE SHIELD | Admitting: Podiatry

## 2014-10-09 DIAGNOSIS — M722 Plantar fascial fibromatosis: Secondary | ICD-10-CM

## 2014-10-09 MED ORDER — TRIAMCINOLONE ACETONIDE 10 MG/ML IJ SUSP
10.0000 mg | Freq: Once | INTRAMUSCULAR | Status: AC
  Administered 2014-10-09: 10 mg

## 2014-10-09 NOTE — Progress Notes (Signed)
   Subjective:    Patient ID: Scott [name], male    DOB: April 08, 1955, 59 y.o.   MRN: [id number]  HPI Patient presents with foot pain in their right foot; heel, x3-4 months. Pt doing PT two times a week - icing foot, with some relief.   Review of Systems  All other systems reviewed and are negative.      Objective:   Physical Exam        Assessment & Plan:

## 2014-10-09 NOTE — Patient Instructions (Signed)

## 2014-10-10 NOTE — Progress Notes (Signed)
Subjective:     Patient ID: Scott [name], male   DOB: 1955/10/17, 59 y.o.   MRN: [id number]  HPI patient states I'm having a lot of pain in the bottom of my right heel and it's making it hard for me to walk comfortably. This is been going on for about 4 months and I've tried PT icing and I seen another physician who was not able to help   Review of Systems  All other systems reviewed and are negative.      Objective:   Physical Exam  Constitutional: He is oriented to person, place, and time.  Cardiovascular: Intact distal pulses.   Musculoskeletal: Normal range of motion.  Neurological: He is oriented to person, place, and time.  Skin: Skin is warm.  Nursing note and vitals reviewed.  neurovascular status was found to be intact with muscle strength adequate and range of motion subtalar midtarsal joint within normal limits. Patient's noted to have good digital perfusion well oriented 3 and does have moderate depression of the arch noted upon weightbearing. Patient has exquisite discomfort in the plantar aspect of the right heel at the insertion of the plantar fascia into the heel bone and also has moderate discomfort on the lateral and central portion of the plantar fascial     Assessment:     Acute plantar fasciitis across the heel right    Plan:     H&P x-rays reviewed with patient and condition explained. Today I injected from the lateral side 3 mg Kenalog 5 mg Xylocaine into the plantar fascia and went ahead and dispensed fascial brace with instructions on usage. Instructed on physical therapy and reappoint in 1 week to reevaluate

## 2014-10-16 ENCOUNTER — Encounter: Payer: Self-pay | Admitting: Podiatry

## 2014-10-16 ENCOUNTER — Ambulatory Visit (INDEPENDENT_AMBULATORY_CARE_PROVIDER_SITE_OTHER): Payer: BLUE CROSS/BLUE SHIELD | Admitting: Podiatry

## 2014-10-16 VITALS — BP 138/84 | HR 78 | Resp 16

## 2014-10-16 DIAGNOSIS — M722 Plantar fascial fibromatosis: Secondary | ICD-10-CM

## 2014-10-17 NOTE — Progress Notes (Signed)
Subjective:     Patient ID: Scott [name], male   DOB: 1955-03-16, 59 y.o.   MRN: [id number]  HPI patient states the pain seems improved but it still present   Review of Systems     Objective:   Physical Exam Neurovascular status intact muscle strength adequate range of motion within normal limits with discomfort in the plantar heel that seems improved    Assessment:     Plantar fasciitis still present with moderate depression of the arch and pain but improved    Plan:     Reviewed condition and at this point discussed physical therapy supportive shoe and scanned for custom orthotics to reduce pressure against his heel and arch. Reappoint when those are returned

## 2014-11-11 ENCOUNTER — Ambulatory Visit: Payer: BLUE CROSS/BLUE SHIELD | Admitting: *Deleted

## 2014-11-11 DIAGNOSIS — M722 Plantar fascial fibromatosis: Secondary | ICD-10-CM

## 2014-11-11 NOTE — Progress Notes (Signed)
Patient ID: Scott [name], male   DOB: 1955-04-20, 59 y.o.   MRN: [id number] Patient presents for orthotic pick up.  Verbal and written break in and wear instructions given.  Patient will follow up in 4 weeks if symptoms worsen or fail to improve.

## 2014-11-11 NOTE — Patient Instructions (Signed)

## 2015-01-05 ENCOUNTER — Other Ambulatory Visit: Payer: Self-pay | Admitting: Gastroenterology

## 2015-02-08 ENCOUNTER — Telehealth: Payer: Self-pay

## 2015-02-08 NOTE — Telephone Encounter (Signed)
I spoke to patient and rescheduled appt for Wednesday due to the weather

## 2015-02-09 ENCOUNTER — Ambulatory Visit: Payer: Self-pay | Admitting: Neurology

## 2015-02-11 ENCOUNTER — Encounter: Payer: Self-pay | Admitting: Neurology

## 2015-02-11 ENCOUNTER — Ambulatory Visit (INDEPENDENT_AMBULATORY_CARE_PROVIDER_SITE_OTHER): Payer: BLUE CROSS/BLUE SHIELD | Admitting: Neurology

## 2015-02-11 VITALS — BP 138/82 | HR 72 | Resp 18 | Ht 75.0 in | Wt 258.0 lb

## 2015-02-11 DIAGNOSIS — G4733 Obstructive sleep apnea (adult) (pediatric): Secondary | ICD-10-CM

## 2015-02-11 DIAGNOSIS — E669 Obesity, unspecified: Secondary | ICD-10-CM

## 2015-02-11 DIAGNOSIS — Z9989 Dependence on other enabling machines and devices: Secondary | ICD-10-CM

## 2015-02-11 NOTE — Progress Notes (Signed)
Subjective:    Patient ID: Scott [name] is a 60 y.o. male.  HPI     Interim history:   Scott [name], is a very pleasant 60 year old right-handed gentleman with an underlying medical history of hyperlipidemia, PE secondary to Crohn's disease (on Imuran), s/p multiple colon resection surgeries, chronic anemia, rosacea, vitamin D deficiency, low testosterone and obesity, who presents for followup consultation of his obstructive sleep apnea, on treatment with CPAP. The patient is unaccompanied today. I last saw him on 02/06/2014, at which time he was compliant with CPAP therapy he had reported doing well. He was not sleeping enough. He was working full-time as an Forensic psychologist. Overall, he felt improved on CPAP therapy and was encouraged to continue to stay compliant with treatment and allow for enough sleep time.  Today, 02/11/2015: I reviewed his CPAP compliance data from 01/10/2015 through 02/08/2015 which is a total of 30 days during which time he used his machine every night with percent used days greater than 4 hours at 93%, indicating excellent compliance with an average usage of 5 hours and 25 minutes, residual AHI at 1.2 per hour, leak for the most part low with the 95th percentile at 9.3 L/m on a pressure of 8 cm with EPR of 3.  Today, 02/11/2015: He reports doing well. He is compliant with CPAP treatment. He is tolerating it well. He feels better when he uses it. His Crohn's is under control and stable. He had his checkup with his primary care physician and reports 2 new medical issues, with the exception of some lower extremity swelling which was worse in the summertime. He has not been able to lose any weight. He likes to drink coffee and sodas, one or 2 per day. He does try to drink enough water as well.  Previously:  I saw him on 07/31/2013, at which time he reported doing well. He had adjusted well to the mask and the pressure. He reported sleeping better with CPAP. He felt less  tired during the day. His nocturia was improved somewhat as well. Overall, he felt much better than before treatment.   I reviewed his compliance data from 12/30/2013 through 01/28/2014 which is a total of 30 days during which time he used his machine every night, percent used days greater than 4 hours was 83%, indicating very good compliance with an average usage of 5 hours and 7 minutes for all nights. Residual AHI was low at 0.7 per hour and leak was low at 5.5 L/m for the 95th percentile. Pressure at 8 cm with EPR of 3.  I first met him on 04/05/2013 at the request of his primary care physician, at which time he reported a long-standing history of snoring and witnessed apneas as well as nocturia, AM HAs and significant daytime somnolence. I suggested he return for sleep study. He had a split-night sleep study on 05/17/2013 and I went over his test results with him in detail today. His baseline sleep efficiency was mildly reduced at 82.8% with a latency to sleep of 6 minutes and wake after sleep onset of 22 minutes with mild sleep fragmentation noted. He had a mildly elevated arousal index. He had an increased percentage of stage I and stage II sleep, a absence of slow-wave sleep and 15.6% of REM sleep with a mildly prolonged REM latency. He had moderate to loud snoring. He had 83 obstructive hypopneas resulting in an AHI of 37 per hour. Face on oxygen saturation was 91% with a nadir  of 71% in supine REM sleep. He was then titrated on CPAP. Sleep efficiency was 85.6% during the treatment portion of the study. Arousal index was improved. He had an increased percentage of stage II sleep, absence of slow-wave sleep and an increased percentage of REM sleep suggesting REM rebound. The average oxygen saturation was improved at 93%. Nadir was 86%. Time below 88% saturation was 54 seconds only. He was noted to have severe periodic leg movements of sleep during the treatment portion of the study with mild arousals at  4.7 per hour. He was titrated on CPAP from 5-8 cm with his AHI reduced to 0 events per hour at the final pressure with supine REM sleep achieved.  I reviewed the patient's CPAP compliance data from 06/26/2013 to 07/18/2013, which is a total of 23 days, during which time the patient used CPAP every day. The average usage for all days was 5 hours and 31 minutes. The percent used days greater than 4 hours was 78 %, indicating good compliance. The residual AHI was 1.2 per hour, indicating an appropriate treatment pressure of 8 cwp with EPR of 3. Air leak from the mask was low at 8 L per minute at the 95th percentile.  I reviewed his compliance data from 06/26/2013 to 07/30/2013, a total of 35 days during which time he uses CPAP every night. Percent used days greater than 4 hours was 80%, indicating good compliance. His average usage was 5 hours and 28 minutes, residual AHI low at 1.1, leak low at 7.3, pressure of the segment 8 with EPR of 3.   His Past Medical History Is Significant For: Past Medical History  Diagnosis Date  . Crohn's disease ([place])   . Pernicious anemia   . OSA on CPAP 07/31/2013    His Past Surgical History Is Significant For: Past Surgical History  Procedure Laterality Date  . Bowel resection      2    His Family History Is Significant For: Family History  Problem Relation Age of Onset  . Cancer Mother   . Cancer Father   . Heart failure Maternal Grandmother   . Cancer Maternal Grandfather   . Sleep apnea Father   . Cancer Paternal Grandmother   . Cancer Paternal Grandfather   . COPD Brother     His Social History Is Significant For: Social History   Social History  . Marital Status: Married    Spouse Name: Threasa Beards  . Number of Children: 4  . Years of Education: 57   Occupational History  .      Attorney   Social History Main Topics  . Smoking status: Former Smoker    Quit date: 09/16/1978  . Smokeless tobacco: Never Used  . Alcohol Use: 1.8 oz/week     3 Standard drinks or equivalent per week     Comment: occas.  . Drug Use: No  . Sexual Activity: Not Asked   Other Topics Concern  . None   Social History Narrative   Patient consumes 1-2 cups caffeine daily    His Allergies Are:  No Known Allergies:   His Current Medications Are:  Outpatient Encounter Prescriptions as of 02/11/2015  Medication Sig  . azaTHIOprine (IMURAN) 50 MG tablet Take 200 mg by mouth daily.  Marland Kitchen CALCIUM PO Take 600 mg by mouth 2 (two) times daily.   . Cholecalciferol (VITAMIN D3) 2000 UNITS TABS Take 3 tablets by mouth daily. Twice daily  . colesevelam (WELCHOL) 625 MG tablet Take  1,875 mg by mouth daily.  . Cyanocobalamin (VITAMIN B 12) 100 MCG LOZG Place 1 tablet under the tongue daily.  . hydrocortisone 2.5 % cream Apply 1 application topically daily.  . Loperamide HCl (IMODIUM PO) Take 4 tablets by mouth 4 (four) times daily.  . Multiple Vitamin (MULTIVITAMIN) capsule Take 1 capsule by mouth daily.  Marland Kitchen omeprazole (PRILOSEC) 10 MG capsule Take 10 mg by mouth daily. Twice daily  . POTASSIUM PO Take 1 tablet by mouth daily. 1/2 tablet by mouth twice daily  . Probiotic Product (ALIGN) 4 MG CAPS Take 1 capsule by mouth daily.  . Testosterone (ANDROGEL) 20.25 MG/1.25GM (1.62%) GEL Place 1 application onto the skin daily.   No facility-administered encounter medications on file as of 02/11/2015.  :  Review of Systems:  Out of a complete 14 point review of systems, all are reviewed and negative with the exception of these symptoms as listed below:   Review of Systems  Neurological:       Patient is here for CPAP f/u. Reports that he sleeps better with it. No new concerns.     Objective:  Neurologic Exam  Physical Exam Physical Examination:   Filed Vitals:   02/11/15 0838  BP: 138/82  Pulse: 72  Resp: 18    General Examination: The patient is a very pleasant 60 y.o. male in no acute distress. He appears well-developed and well-nourished and very  well groomed. He is in good spirits today.   HEENT: Normocephalic, atraumatic, pupils are equal, round and reactive to light and accommodation. Funduscopic exam is normal with sharp disc margins noted. Extraocular tracking is good without limitation to gaze excursion or nystagmus noted. Normal smooth pursuit is noted. Hearing is grossly intact. Face is symmetric with normal facial animation and normal facial sensation. Speech is clear with no dysarthria noted. There is no hypophonia. There is no lip, neck/head, jaw or voice tremor. Neck is supple with full range of passive and active motion. There are no carotid bruits on auscultation. Oropharynx exam reveals: mild mouth dryness, adequate dental hygiene and mild to perhaps moderate airway crowding, due to larger tongue and longer uvula with soft palate redundancy. Mallampati is class II. Tongue protrudes centrally and palate elevates symmetrically. Tonsils are 1+ in size.   Chest: Clear to auscultation without wheezing, rhonchi or crackles noted.  Heart: S1+S2+0, regular and normal without murmurs, rubs or gallops noted.   Abdomen: Soft, non-tender and non-distended with normal bowel sounds appreciated on auscultation.  Extremities: There is trace pitting edema in the distal lower extremities bilaterally, around both ankles.   Skin: Warm and dry without trophic changes noted. There are no varicose veins.  Musculoskeletal: exam reveals no obvious joint deformities, tenderness or joint swelling or erythema.   Neurologically:  Mental status: The patient is awake, alert and oriented in all 4 spheres. His immediate and remote memory, attention, language skills and fund of knowledge are appropriate. There is no evidence of aphasia, agnosia, apraxia or anomia. Speech is clear with normal prosody and enunciation. Thought process is linear. Mood is normal and affect is normal.  Cranial nerves II - XII are as described above under HEENT exam. In addition:  shoulder shrug is normal with equal shoulder height noted. Motor exam: Normal bulk, strength and tone is noted. There is no drift, tremor or rebound. Romberg is negative. Fine motor skills and coordination: intact with normal finger taps, normal hand movements, normal rapid alternating patting, normal foot taps and normal foot  agility.  Cerebellar testing: No dysmetria or intention tremor on finger to nose testing. Heel to shin is unremarkable bilaterally. There is no truncal or gait ataxia.  Sensory exam: intact to light touch in the upper and lower extremities.  Gait, station and balance: He stands easily. No veering to one side is noted. No leaning to one side is noted. Posture is age-appropriate and stance is narrow based. Gait shows normal stride length and normal pace. No problems turning are noted. He turns en bloc. Tandem walk is unremarkable.            Assessment and Plan:   In summary, Scott [name] is a very pleasant 60 year old male with an underlying medical history of hyperlipidemia, PE secondary to Crohn's disease (on Imuran), s/p multiple colon resection surgeries, chronic anemia, rosacea, vitamin D deficiency, low testosterone and obesity, who presents for followup consultation of his obstructive sleep apnea. He has severe sleep apnea and is well treated with CPAP, indicating good tolerance of the mask and pressure and full compliance. I reviewed his CPAP compliance data with him. He is commended for his treatment adherence. He is advised to focus on making more time for sleep and focus on weight loss. He's also advised to monitor his lower extremity swelling and be mindful of his salt intake. His physical exam is otherwise stable. He is advised to follow-up in one year, sooner if needed. I will renew his CPAP supply order and fax it to his DME company, [employer]. He is encouraged to call for any interim questions or concerns. I answered all his questions today and he was in agreement.   I spent 15 minutes in total face-to-face time with the patient, more than 50% of which was spent in counseling and coordination of care, reviewing test results, reviewing medication and discussing or reviewing the diagnosis of OSA, its prognosis and treatment options.

## 2015-02-11 NOTE — Patient Instructions (Addendum)
Please continue using your CPAP regularly. While your insurance requires that you use CPAP at least 4 hours each night on 70% of the nights, I recommend, that you not skip any nights and use it throughout the night if you can. Getting used to CPAP and staying with the treatment long term does take time and patience and discipline. Untreated obstructive sleep apnea when it is moderate to severe can have an adverse impact on cardiovascular health and raise her risk for heart disease, arrhythmias, hypertension, congestive heart failure, stroke and diabetes. Untreated obstructive sleep apnea causes sleep disruption, nonrestorative sleep, and sleep deprivation. This can have an impact on your day to day functioning and cause daytime sleepiness and impairment of cognitive function, memory loss, mood disturbance, and problems focussing. Using CPAP regularly can improve these symptoms.  Keep up the good work! I will see you back in a year.   Please work on weight loss. Try to make more time for sleep.

## 2015-07-21 DIAGNOSIS — G4733 Obstructive sleep apnea (adult) (pediatric): Secondary | ICD-10-CM | POA: Diagnosis not present

## 2015-08-12 DIAGNOSIS — N400 Enlarged prostate without lower urinary tract symptoms: Secondary | ICD-10-CM | POA: Diagnosis not present

## 2015-08-12 DIAGNOSIS — E291 Testicular hypofunction: Secondary | ICD-10-CM | POA: Diagnosis not present

## 2015-08-19 DIAGNOSIS — Z125 Encounter for screening for malignant neoplasm of prostate: Secondary | ICD-10-CM | POA: Diagnosis not present

## 2015-08-19 DIAGNOSIS — Z0000 Encounter for general adult medical examination without abnormal findings: Secondary | ICD-10-CM | POA: Diagnosis not present

## 2015-08-19 DIAGNOSIS — E559 Vitamin D deficiency, unspecified: Secondary | ICD-10-CM | POA: Diagnosis not present

## 2015-08-19 DIAGNOSIS — E538 Deficiency of other specified B group vitamins: Secondary | ICD-10-CM | POA: Diagnosis not present

## 2015-09-01 DIAGNOSIS — Z0000 Encounter for general adult medical examination without abnormal findings: Secondary | ICD-10-CM | POA: Diagnosis not present

## 2015-09-01 DIAGNOSIS — M25511 Pain in right shoulder: Secondary | ICD-10-CM | POA: Diagnosis not present

## 2015-09-01 DIAGNOSIS — K219 Gastro-esophageal reflux disease without esophagitis: Secondary | ICD-10-CM | POA: Diagnosis not present

## 2015-09-01 DIAGNOSIS — M722 Plantar fascial fibromatosis: Secondary | ICD-10-CM | POA: Diagnosis not present

## 2015-09-01 DIAGNOSIS — Z1389 Encounter for screening for other disorder: Secondary | ICD-10-CM | POA: Diagnosis not present

## 2015-09-01 DIAGNOSIS — R7309 Other abnormal glucose: Secondary | ICD-10-CM | POA: Diagnosis not present

## 2015-10-22 DIAGNOSIS — G4733 Obstructive sleep apnea (adult) (pediatric): Secondary | ICD-10-CM | POA: Diagnosis not present

## 2015-10-22 DIAGNOSIS — K5080 Crohn's disease of both small and large intestine without complications: Secondary | ICD-10-CM | POA: Diagnosis not present

## 2015-10-28 DIAGNOSIS — Z23 Encounter for immunization: Secondary | ICD-10-CM | POA: Diagnosis not present

## 2016-01-11 DIAGNOSIS — K5080 Crohn's disease of both small and large intestine without complications: Secondary | ICD-10-CM | POA: Diagnosis not present

## 2016-01-26 DIAGNOSIS — G4733 Obstructive sleep apnea (adult) (pediatric): Secondary | ICD-10-CM | POA: Diagnosis not present

## 2016-02-09 ENCOUNTER — Encounter: Payer: Self-pay | Admitting: Neurology

## 2016-02-11 ENCOUNTER — Ambulatory Visit (INDEPENDENT_AMBULATORY_CARE_PROVIDER_SITE_OTHER): Payer: BLUE CROSS/BLUE SHIELD | Admitting: Neurology

## 2016-02-11 ENCOUNTER — Encounter: Payer: Self-pay | Admitting: Neurology

## 2016-02-11 VITALS — BP 138/70 | HR 70 | Resp 18 | Ht 75.0 in | Wt 258.0 lb

## 2016-02-11 DIAGNOSIS — Z9989 Dependence on other enabling machines and devices: Secondary | ICD-10-CM | POA: Diagnosis not present

## 2016-02-11 DIAGNOSIS — G4733 Obstructive sleep apnea (adult) (pediatric): Secondary | ICD-10-CM

## 2016-02-11 DIAGNOSIS — G8929 Other chronic pain: Secondary | ICD-10-CM | POA: Diagnosis not present

## 2016-02-11 DIAGNOSIS — M5440 Lumbago with sciatica, unspecified side: Secondary | ICD-10-CM | POA: Diagnosis not present

## 2016-02-11 NOTE — Progress Notes (Signed)
Subjective:    Patient ID: Scott [name] is a 61 y.o. male.  HPI     Interim history:   Mr. Rebert, is a very pleasant 61 year old right-handed gentleman with an underlying medical history of hyperlipidemia, PE secondary to Crohn's disease (on Imuran), s/p multiple colon resection surgeries,  chronic anemia, rosacea, vitamin D deficiency, low testosterone and  obesity, who presents for followup consultation of his obstructive sleep apnea, on treatment with CPAP. The patient is unaccompanied today. I last saw him on 02/11/2015, at which time he reported doing well, he was compliant with CPAP therapy. He is Crohn's disease was stable. He was trying to lose weight but had not lost much in the way of weight.  Today, 02/11/2016: I reviewed his CPAP compliance data from 01/11/2016 through 02/09/2016 which is a total of 30 days, during which time he used his machine every night with percent used days greater than 4 hours at 87%, indicating very good compliance with an average usage of 5 hours and 12 minutes, residual AHI 0.8 per hour, leak acceptable with the 95th percentile at 13.5 L/m on a pressure of 8 cm with EPR of 3.  Today, 02/11/2016: He reports doing well. Some R shoulder pain, had injection and PT. He has R LBP, some L toe numbness. Had seen a foot doctor, has inserts. No recent other issues. Likes to drink coffee in AM and diet soda at lunch and dinner. He has had low back pain for some time, but in the past week it has flared up to some degree. His left fifth, fourth and third toe occasionally feel numb, he has right foot plantar fasciitis.   Previously:   I saw him on 02/06/2014, at which time he was compliant with CPAP therapy he had reported doing well. He was not sleeping enough. He was working full-time as an Forensic psychologist. Overall, he felt improved on CPAP therapy and was encouraged to continue to stay compliant with treatment and allow for enough sleep time.   I reviewed his  CPAP compliance data from 01/10/2015 through 02/08/2015 which is a total of 30 days during which time he used his machine every night with percent used days greater than 4 hours at 93%, indicating excellent compliance with an average usage of 5 hours and 25 minutes, residual AHI at 1.2 per hour, leak for the most part low with the 95th percentile at 9.3 L/m on a pressure of 8 cm with EPR of 3.   Today, 02/11/2015: He reports doing well. He is compliant with CPAP treatment. He is tolerating it well. He feels better when he uses it. His Crohn's is under control and stable. He had his checkup with his primary care physician and reports 2 new medical issues, with the exception of some lower extremity swelling which was worse in the summertime. He has not been able to lose any weight. He likes to drink coffee and sodas, one or 2 per day. He does try to drink enough water as well.   Previously:   I saw him on 07/31/2013, at which time he reported doing well. He had adjusted well to the mask and the pressure. He reported sleeping better with CPAP. He felt less tired during the day. His nocturia was improved somewhat as well. Overall, he felt much better than before treatment.    I reviewed his compliance data from 12/30/2013 through 01/28/2014 which is a total of 30 days during which time he used his machine every night,  percent used days greater than 4 hours was 83%, indicating very good compliance with an average usage of 5 hours and 7 minutes for all nights. Residual AHI was low at 0.7 per hour and leak was low at 5.5 L/m for the 95th percentile. Pressure at 8 cm with EPR of 3.   I first met him on 04/05/2013 at the request of his primary care physician, at which time he reported a long-standing history of snoring and witnessed apneas as well as nocturia, AM HAs and significant daytime somnolence. I suggested he return for sleep study. He had a split-night sleep study on 05/17/2013 and I went over his test  results with him in detail today. His baseline sleep efficiency was mildly reduced at 82.8% with a latency to sleep of 6 minutes and wake after sleep onset of 22 minutes with mild sleep fragmentation noted. He had a mildly elevated arousal index. He had an increased percentage of stage I and stage II sleep, a absence of slow-wave sleep and 15.6% of REM sleep with a mildly prolonged REM latency. He had moderate to loud snoring. He had 83 obstructive hypopneas resulting in an AHI of 37 per hour. Face on oxygen saturation was 91% with a nadir of 71% in supine REM sleep. He was then titrated on CPAP. Sleep efficiency was 85.6% during the treatment portion of the study. Arousal index was improved. He had an increased percentage of stage II sleep, absence of slow-wave sleep and an increased percentage of REM sleep suggesting REM rebound. The average oxygen saturation was improved at 93%. Nadir was 86%. Time below 88% saturation was 54 seconds only. He was noted to have severe periodic leg movements of sleep during the treatment portion of the study with mild arousals at 4.7 per hour. He was titrated on CPAP from 5-8 cm with his AHI reduced to 0 events per hour at the final pressure with supine REM sleep achieved.   I reviewed the patient's CPAP compliance data from 06/26/2013 to 07/18/2013, which is a total of 23 days, during which time the patient used CPAP every day. The average usage for all days was 5 hours and 31 minutes. The percent used days greater than 4 hours was 78 %, indicating good compliance. The residual AHI was 1.2 per hour, indicating an appropriate treatment pressure of 8 cwp with EPR of 3. Air leak from the mask was low at 8 L per minute at the 95th percentile.   I reviewed his compliance data from 06/26/2013 to 07/30/2013, a total of 35 days during which time he uses CPAP every night. Percent used days greater than 4 hours was 80%, indicating good compliance. His average usage was 5 hours and 28  minutes, residual AHI low at 1.1, leak low at 7.3, pressure of the segment 8 with EPR of 3.   His Past Medical History Is Significant For: Past Medical History:  Diagnosis Date  . Crohn's disease ([place])   . OSA on CPAP 07/31/2013  . Pernicious anemia     His Past Surgical History Is Significant For: Past Surgical History:  Procedure Laterality Date  . BOWEL RESECTION     2    His Family History Is Significant For: Family History  Problem Relation Age of Onset  . Cancer Mother   . Cancer Father   . Heart failure Maternal Grandmother   . Cancer Maternal Grandfather   . Sleep apnea Father   . Cancer Paternal Grandmother   . Cancer  Paternal Grandfather   . COPD Brother     His Social History Is Significant For: Social History   Social History  . Marital status: Married    Spouse name: Threasa Beards  . Number of children: 4  . Years of education: 47   Occupational History  .      Attorney   Social History Main Topics  . Smoking status: Former Smoker    Quit date: 09/16/1978  . Smokeless tobacco: Never Used  . Alcohol use 1.8 oz/week    3 Standard drinks or equivalent per week     Comment: occas.  . Drug use: No  . Sexual activity: Not Asked   Other Topics Concern  . None   Social History Narrative   Patient consumes 1-2 cups caffeine daily    His Allergies Are:  No Known Allergies:   His Current Medications Are:  Outpatient Encounter Prescriptions as of 02/11/2016  Medication Sig  . azaTHIOprine (IMURAN) 50 MG tablet Take 200 mg by mouth daily.  Marland Kitchen CALCIUM PO Take 600 mg by mouth 2 (two) times daily.   . Cholecalciferol (VITAMIN D3) 2000 UNITS TABS Take 2 tablets by mouth daily. Twice daily  . colesevelam (WELCHOL) 625 MG tablet Take 625 mg by mouth 2 (two) times daily with a meal.   . Cyanocobalamin (VITAMIN B 12) 100 MCG LOZG Place 1 tablet under the tongue daily.  . hydrocortisone 2.5 % cream Apply 1 application topically daily.  . Loperamide HCl (IMODIUM PO)  Take 4 tablets by mouth 4 (four) times daily.  . Multiple Vitamin (MULTIVITAMIN) capsule Take 1 capsule by mouth daily.  Marland Kitchen omeprazole (PRILOSEC) 10 MG capsule Take 10 mg by mouth daily.   Marland Kitchen POTASSIUM PO Take 1 tablet by mouth daily.   . Probiotic Product (ALIGN) 4 MG CAPS Take 1 capsule by mouth daily.  . Testosterone (ANDROGEL TD) Place 1 application onto the skin daily.   No facility-administered encounter medications on file as of 02/11/2016.   :  Review of Systems:  Out of a complete 14 point review of systems, all are reviewed and negative with the exception of these symptoms as listed below: Review of Systems  Neurological:       Patient states that he is doing well with CPAP.   Reports new onset of numb toes and back pain.     Objective:  Neurologic Exam  Physical Exam Physical Examination:   Vitals:   02/11/16 0847  BP: 138/70  Pulse: 70  Resp: 18    General Examination: The patient is a very pleasant 61 y.o. male in no acute distress. He appears well-developed and well-nourished and very well groomed. He is in good spirits today, as is his usual.   HEENT: Normocephalic, atraumatic, pupils are equal, round and reactive to light and accommodation. Extraocular tracking is good without limitation to gaze excursion or nystagmus noted. Normal smooth pursuit is noted. Hearing is grossly intact. Face is symmetric with normal facial animation and normal facial sensation. Speech is clear with no dysarthria noted. There is no hypophonia. There is no lip, neck/head, jaw or voice tremor. Neck is supple with full range of passive and active motion. There are no carotid bruits on auscultation. Oropharynx exam reveals: mild mouth dryness, adequate dental hygiene and mild to perhaps moderate airway crowding, due to larger tongue and longer uvula with soft palate redundancy. Mallampati is class II. Tongue protrudes centrally and palate elevates symmetrically. Tonsils are 1+ in size.  Chest: Clear to auscultation without wheezing, rhonchi or crackles noted.  Heart: S1+S2+0, regular and normal without murmurs, rubs or gallops noted.   Abdomen: Soft, non-tender and non-distended with normal bowel sounds appreciated on auscultation.  Extremities: There is trace pitting edema in the distal lower extremities bilaterally, around both ankles, stable.   Skin: Warm and dry without trophic changes noted. There are no varicose veins.  Musculoskeletal: exam reveals no obvious joint deformities, tenderness or joint swelling or erythema.   Neurologically:  Mental status: The patient is awake, alert and oriented in all 4 spheres. His immediate and remote memory, attention, language skills and fund of knowledge are appropriate. There is no evidence of aphasia, agnosia, apraxia or anomia. Speech is clear with normal prosody and enunciation. Thought process is linear. Mood is normal and affect is normal.  Cranial nerves II - XII are as described above under HEENT exam. In addition: shoulder shrug is normal with equal shoulder height noted. Motor exam: Normal bulk, strength and tone is noted. There is no drift, tremor or rebound. Romberg is negative. Fine motor skills and coordination: intact with normal finger taps, normal hand movements, normal rapid alternating patting, normal foot taps and normal foot agility.  Cerebellar testing: No dysmetria or intention tremor on finger to nose testing. Heel to shin is unremarkable bilaterally. There is no truncal or gait ataxia.  Reflexes are 2+ throughout, toes are downgoing. Sensory exam: intact to light touch in the upper and lower extremities.  Gait, station and balance: He stands easily, slight increase in lumbar kyphosis is noted. He is slightly leaning to the right. Otherwise, posture is appropriate, walking is unremarkable and he is able to do tandem walk.          Assessment and Plan:   In summary, GENNIE DIB is a very pleasant  61 year old male with an underlying medical history of hyperlipidemia, PE secondary to Crohn's disease (on Imuran), s/p multiple colon resection surgeries, chronic anemia, rosacea, vitamin D deficiency, low testosterone and obesity, who presents for followup consultation of his obstructive sleep apnea. He hasA history of severe obstructive sleep apnea, has been fully compliant with CPAP therapy and treatment settings are adequate at this time with an overall AHI less than 1 per hour, leak acceptable, the only thing I asked him to do is make more time for sleep as he only averages a usage time of 5 hours and 12 minutes, of which he is mostly asleep he indicates. He does agree that he needs to make more time for sleep as he goes to bed late around 1 or 2 AM on average and wake up time is around 6 AM. He does not have any significant nocturia at this time and overall feels well. He has had some low back pain, little flareup in the past week. He is advised to monitor, use over-the-counter treatment measures but if this consistently bothers him he can certainly talk to his PCP about getting a lumbar spine MRI. He has previously been seen at [facility] for his right shoulder issues, could potentially go back there. As far as his intermittent left toe numbness, he is advised to monitor this, tried wider shoes as it could be localized compression. Nevertheless, he is also on immune suppressants which some more than others potentially can cause peripheral neuropathy. We will monitor as this is not life altering or significantly progressive at this time. He is agreeable. From a sleep apnea standpoint I would like to  see him back in a year, I renewed his CPAP supply orders and we will fax the order to his current DME company, [place]. I answered all his questions today and he was in agreement.  I spent 20 minutes in total face-to-face time with the patient, more than 50% of which was spent in counseling and coordination of  care, reviewing test results, reviewing medication and discussing or reviewing the diagnosis of OSA, its prognosis and treatment options.

## 2016-02-11 NOTE — Patient Instructions (Addendum)
Please continue using your CPAP regularly. While your insurance requires that you use CPAP at least 4 hours each night on 70% of the nights, I recommend, that you not skip any nights and use it throughout the night if you can. Getting used to CPAP and staying with the treatment long term does take time and patience and discipline. Untreated obstructive sleep apnea when it is moderate to severe can have an adverse impact on cardiovascular health and raise her risk for heart disease, arrhythmias, hypertension, congestive heart failure, stroke and diabetes. Untreated obstructive sleep apnea causes sleep disruption, nonrestorative sleep, and sleep deprivation. This can have an impact on your day to day functioning and cause daytime sleepiness and impairment of cognitive function, memory loss, mood disturbance, and problems focussing. Using CPAP regularly can improve these symptoms. Try to make more time for sleep! Drink more water! Keep up the good work! I will see you back in 12 months for sleep apnea check up.  Let us monitor your back pain and toe numbness, try to change shoes for wider shoes.

## 2016-02-18 DIAGNOSIS — R05 Cough: Secondary | ICD-10-CM | POA: Diagnosis not present

## 2016-02-19 DIAGNOSIS — M545 Low back pain: Secondary | ICD-10-CM | POA: Diagnosis not present

## 2016-02-22 DIAGNOSIS — M5116 Intervertebral disc disorders with radiculopathy, lumbar region: Secondary | ICD-10-CM | POA: Diagnosis not present

## 2016-02-22 DIAGNOSIS — R262 Difficulty in walking, not elsewhere classified: Secondary | ICD-10-CM | POA: Diagnosis not present

## 2016-02-22 DIAGNOSIS — M47896 Other spondylosis, lumbar region: Secondary | ICD-10-CM | POA: Diagnosis not present

## 2016-02-25 DIAGNOSIS — M47896 Other spondylosis, lumbar region: Secondary | ICD-10-CM | POA: Diagnosis not present

## 2016-02-25 DIAGNOSIS — R262 Difficulty in walking, not elsewhere classified: Secondary | ICD-10-CM | POA: Diagnosis not present

## 2016-02-25 DIAGNOSIS — M5116 Intervertebral disc disorders with radiculopathy, lumbar region: Secondary | ICD-10-CM | POA: Diagnosis not present

## 2016-03-07 DIAGNOSIS — R262 Difficulty in walking, not elsewhere classified: Secondary | ICD-10-CM | POA: Diagnosis not present

## 2016-03-07 DIAGNOSIS — M5116 Intervertebral disc disorders with radiculopathy, lumbar region: Secondary | ICD-10-CM | POA: Diagnosis not present

## 2016-03-07 DIAGNOSIS — M47896 Other spondylosis, lumbar region: Secondary | ICD-10-CM | POA: Diagnosis not present

## 2016-03-09 DIAGNOSIS — M5116 Intervertebral disc disorders with radiculopathy, lumbar region: Secondary | ICD-10-CM | POA: Diagnosis not present

## 2016-03-09 DIAGNOSIS — R262 Difficulty in walking, not elsewhere classified: Secondary | ICD-10-CM | POA: Diagnosis not present

## 2016-03-09 DIAGNOSIS — M47896 Other spondylosis, lumbar region: Secondary | ICD-10-CM | POA: Diagnosis not present

## 2016-03-14 DIAGNOSIS — M47896 Other spondylosis, lumbar region: Secondary | ICD-10-CM | POA: Diagnosis not present

## 2016-03-14 DIAGNOSIS — M5116 Intervertebral disc disorders with radiculopathy, lumbar region: Secondary | ICD-10-CM | POA: Diagnosis not present

## 2016-03-14 DIAGNOSIS — R262 Difficulty in walking, not elsewhere classified: Secondary | ICD-10-CM | POA: Diagnosis not present

## 2016-03-16 DIAGNOSIS — R262 Difficulty in walking, not elsewhere classified: Secondary | ICD-10-CM | POA: Diagnosis not present

## 2016-03-16 DIAGNOSIS — M47896 Other spondylosis, lumbar region: Secondary | ICD-10-CM | POA: Diagnosis not present

## 2016-03-16 DIAGNOSIS — M5116 Intervertebral disc disorders with radiculopathy, lumbar region: Secondary | ICD-10-CM | POA: Diagnosis not present

## 2016-03-21 DIAGNOSIS — M5116 Intervertebral disc disorders with radiculopathy, lumbar region: Secondary | ICD-10-CM | POA: Diagnosis not present

## 2016-03-21 DIAGNOSIS — M47896 Other spondylosis, lumbar region: Secondary | ICD-10-CM | POA: Diagnosis not present

## 2016-03-21 DIAGNOSIS — R262 Difficulty in walking, not elsewhere classified: Secondary | ICD-10-CM | POA: Diagnosis not present

## 2016-03-23 DIAGNOSIS — R262 Difficulty in walking, not elsewhere classified: Secondary | ICD-10-CM | POA: Diagnosis not present

## 2016-03-23 DIAGNOSIS — M47896 Other spondylosis, lumbar region: Secondary | ICD-10-CM | POA: Diagnosis not present

## 2016-03-23 DIAGNOSIS — M5116 Intervertebral disc disorders with radiculopathy, lumbar region: Secondary | ICD-10-CM | POA: Diagnosis not present

## 2016-04-25 DIAGNOSIS — G4733 Obstructive sleep apnea (adult) (pediatric): Secondary | ICD-10-CM | POA: Diagnosis not present

## 2016-07-22 DIAGNOSIS — Z0100 Encounter for examination of eyes and vision without abnormal findings: Secondary | ICD-10-CM | POA: Diagnosis not present

## 2016-07-26 DIAGNOSIS — G4733 Obstructive sleep apnea (adult) (pediatric): Secondary | ICD-10-CM | POA: Diagnosis not present

## 2016-08-30 DIAGNOSIS — R7309 Other abnormal glucose: Secondary | ICD-10-CM | POA: Diagnosis not present

## 2016-08-30 DIAGNOSIS — Z0000 Encounter for general adult medical examination without abnormal findings: Secondary | ICD-10-CM | POA: Diagnosis not present

## 2016-08-30 DIAGNOSIS — E538 Deficiency of other specified B group vitamins: Secondary | ICD-10-CM | POA: Diagnosis not present

## 2016-08-30 DIAGNOSIS — E559 Vitamin D deficiency, unspecified: Secondary | ICD-10-CM | POA: Diagnosis not present

## 2016-08-30 DIAGNOSIS — E298 Other testicular dysfunction: Secondary | ICD-10-CM | POA: Diagnosis not present

## 2016-09-06 DIAGNOSIS — Z0000 Encounter for general adult medical examination without abnormal findings: Secondary | ICD-10-CM | POA: Diagnosis not present

## 2016-09-06 DIAGNOSIS — G4733 Obstructive sleep apnea (adult) (pediatric): Secondary | ICD-10-CM | POA: Diagnosis not present

## 2016-09-06 DIAGNOSIS — Z8042 Family history of malignant neoplasm of prostate: Secondary | ICD-10-CM | POA: Diagnosis not present

## 2016-09-06 DIAGNOSIS — E298 Other testicular dysfunction: Secondary | ICD-10-CM | POA: Diagnosis not present

## 2016-09-06 DIAGNOSIS — M25561 Pain in right knee: Secondary | ICD-10-CM | POA: Diagnosis not present

## 2016-09-06 DIAGNOSIS — Z1389 Encounter for screening for other disorder: Secondary | ICD-10-CM | POA: Diagnosis not present

## 2016-09-14 DIAGNOSIS — S83241A Other tear of medial meniscus, current injury, right knee, initial encounter: Secondary | ICD-10-CM | POA: Diagnosis not present

## 2016-09-19 DIAGNOSIS — M25561 Pain in right knee: Secondary | ICD-10-CM | POA: Diagnosis not present

## 2016-09-21 DIAGNOSIS — M25561 Pain in right knee: Secondary | ICD-10-CM | POA: Diagnosis not present

## 2016-10-17 DIAGNOSIS — B078 Other viral warts: Secondary | ICD-10-CM | POA: Diagnosis not present

## 2016-10-17 DIAGNOSIS — D485 Neoplasm of uncertain behavior of skin: Secondary | ICD-10-CM | POA: Diagnosis not present

## 2016-10-17 DIAGNOSIS — L821 Other seborrheic keratosis: Secondary | ICD-10-CM | POA: Diagnosis not present

## 2016-10-17 DIAGNOSIS — C4442 Squamous cell carcinoma of skin of scalp and neck: Secondary | ICD-10-CM | POA: Diagnosis not present

## 2016-10-17 DIAGNOSIS — L570 Actinic keratosis: Secondary | ICD-10-CM | POA: Diagnosis not present

## 2016-10-26 DIAGNOSIS — G4733 Obstructive sleep apnea (adult) (pediatric): Secondary | ICD-10-CM | POA: Diagnosis not present

## 2016-10-28 DIAGNOSIS — Z23 Encounter for immunization: Secondary | ICD-10-CM | POA: Diagnosis not present

## 2016-12-13 DIAGNOSIS — L570 Actinic keratosis: Secondary | ICD-10-CM | POA: Diagnosis not present

## 2016-12-20 DIAGNOSIS — N5201 Erectile dysfunction due to arterial insufficiency: Secondary | ICD-10-CM | POA: Diagnosis not present

## 2016-12-20 DIAGNOSIS — E291 Testicular hypofunction: Secondary | ICD-10-CM | POA: Diagnosis not present

## 2017-01-12 ENCOUNTER — Telehealth: Payer: Self-pay | Admitting: Neurology

## 2017-01-12 DIAGNOSIS — G4733 Obstructive sleep apnea (adult) (pediatric): Secondary | ICD-10-CM

## 2017-01-12 DIAGNOSIS — Z9989 Dependence on other enabling machines and devices: Secondary | ICD-10-CM

## 2017-01-12 NOTE — Telephone Encounter (Signed)
Pt wife called, they are going out of country for about 2 weeks and pt wife went on site for [facility] to order a travel size CPAP, she received an email that a prescription is needed.  Pt wife is asking to be called for this request please

## 2017-01-12 NOTE — Addendum Note (Signed)
Addended by: [name] on: 01/12/2017 03:55 PM   Modules accepted: Orders

## 2017-01-12 NOTE — Telephone Encounter (Signed)
Pt wife made aware the order was sent to [facility], she is asking for a call on 01-13-17 from RN Baxter Hire

## 2017-01-12 NOTE — Telephone Encounter (Signed)
Spoke to pt's wife, she is asking that the order be faxed to [facility] at [phone]. Will resend order.

## 2017-01-12 NOTE — Telephone Encounter (Signed)
Order placed for Travel CPAP. Please fax to DME.

## 2017-01-12 NOTE — Telephone Encounter (Signed)
I called pt, advised him that the travel cpap order has been sent to [facility]. Pt verbalized understanding.

## 2017-01-25 DIAGNOSIS — G4733 Obstructive sleep apnea (adult) (pediatric): Secondary | ICD-10-CM | POA: Diagnosis not present

## 2017-02-01 NOTE — Telephone Encounter (Signed)
Patient has questions about downloading his travel CPAP. He has appointment with Dr. Frances Furbish on 02-28-17.

## 2017-02-02 NOTE — Telephone Encounter (Signed)
I called pt to discuss. No answer, left a message asking him to call me back.  I am able to see pt's compliance and therapy report from his cpap from the last 30 days. Is this what he has questions about?

## 2017-02-02 NOTE — Telephone Encounter (Signed)
Pt has called back to inform RN Baxter Hire that no his question is not about pt's compliance and therapy report from his cpap from the last 30 days.  Pt is going on an 11 day trip out of the country and will be using a portable unit.  Pt has questions re: reading being received about the portable unit.  Pt is asking for a call back

## 2017-02-02 NOTE — Telephone Encounter (Signed)
I called pt. He is getting a travel cpap from [place] to use for 11 days while he is on a trip and wants to know if we will be able to download this travel cpap. I advised him that I am unsure of this, but his DME, [facility], should be able to download the travel cpap and fax [place] the results. I asked pt to contact [facility] regarding this. I do not know if the travel cpaps have the SD card that is able to be downloaded by our office. Pt verbalized understanding and will call [facility].

## 2017-02-13 ENCOUNTER — Ambulatory Visit: Payer: BLUE CROSS/BLUE SHIELD | Admitting: Neurology

## 2017-02-28 ENCOUNTER — Ambulatory Visit (INDEPENDENT_AMBULATORY_CARE_PROVIDER_SITE_OTHER): Payer: BLUE CROSS/BLUE SHIELD | Admitting: Neurology

## 2017-02-28 ENCOUNTER — Encounter: Payer: Self-pay | Admitting: Neurology

## 2017-02-28 VITALS — BP 133/88 | HR 85 | Ht 75.0 in | Wt 259.0 lb

## 2017-02-28 DIAGNOSIS — G4733 Obstructive sleep apnea (adult) (pediatric): Secondary | ICD-10-CM | POA: Diagnosis not present

## 2017-02-28 DIAGNOSIS — Z9989 Dependence on other enabling machines and devices: Secondary | ICD-10-CM | POA: Diagnosis not present

## 2017-02-28 NOTE — Patient Instructions (Addendum)
Please continue using your CPAP regularly. While your insurance requires that you use CPAP at least 4 hours each night on 70% of the nights, I recommend, that you not skip any nights and use it throughout the night if you can. Getting used to CPAP and staying with the treatment long term does take time and patience and discipline. Untreated obstructive sleep apnea when it is moderate to severe can have an adverse impact on cardiovascular health and raise her risk for heart disease, arrhythmias, hypertension, congestive heart failure, stroke and diabetes. Untreated obstructive sleep apnea causes sleep disruption, nonrestorative sleep, and sleep deprivation. This can have an impact on your day to day functioning and cause daytime sleepiness and impairment of cognitive function, memory loss, mood disturbance, and problems focussing. Using CPAP regularly can improve these symptoms. Please continue to work on your weight. Keep up the good work! We can see you in 1 year, you can see one of our nurse practitioners as you are stable. I will see you after that.

## 2017-02-28 NOTE — Progress Notes (Signed)
Subjective:    Patient ID: Scott [name] is a 62 y.o. male.  HPI     Interim history:   Scott [name], is a very pleasant 62 year old right-handed gentleman with an underlying medical history of hyperlipidemia, PE secondary to Crohn's disease (on Imuran), s/p multiple colon resection surgeries, chronic anemia, rosacea, vitamin D deficiency, low testosterone and  obesity, who presents for followup consultation of his obstructive sleep apnea, on treatment with CPAP. The patient is unaccompanied today. I last saw him on 02/11/2016 at which time he was doing well, compliant with his CPAP.   Today, 02/28/17: I reviewed his CPAP compliance data from 01/28/2017 through 02/26/2017 which is a total of 30 days, during which time he used his home CPAP 18 days but also his travel CPAP. On the home CPAP his average usage was 5 hours and 47 minutes, residual AHI at goal at 1.1 per hour, leak acceptable with the 95th percentile at 9.3 L/m on a pressure of 8 cm with EPR of 3. A download is not possible from his travel CPAP. He is not sure if it is that at the same pressure level as his home CPAP. Of note, I provided him with a travel CPAP prescription last year in December, it was supposed to be set at the same pressure level as his regular CPAP. He is advised to double check with his DME company. He reports doing well, compliant with CPAP, no recent illness, no major changes to medical Hx of medications, no longer on testosterone. Crohn's stable. Tries to hydrate well with water.    Previously:  I saw him on 02/11/2015, at which time he reported doing well, he was compliant with CPAP therapy. He is Crohn's disease was stable. He was trying to lose weight but had not lost much in the way of weight.   I reviewed his CPAP compliance data from 01/11/2016 through 02/09/2016 which is a total of 30 days, during which time he used his machine every night with percent used days greater than 4 hours at 87%,  indicating very good compliance with an average usage of 5 hours and 12 minutes, residual AHI 0.8 per hour, leak acceptable with the 95th percentile at 13.5 L/m on a pressure of 8 cm with EPR of 3.    I saw him on 02/06/2014, at which time he was compliant with CPAP therapy he had reported doing well. He was not sleeping enough. He was working full-time as an Forensic psychologist. Overall, he felt improved on CPAP therapy and was encouraged to continue to stay compliant with treatment and allow for enough sleep time.   I reviewed his CPAP compliance data from 01/10/2015 through 02/08/2015 which is a total of 30 days during which time he used his machine every night with percent used days greater than 4 hours at 93%, indicating excellent compliance with an average usage of 5 hours and 25 minutes, residual AHI at 1.2 per hour, leak for the most part low with the 95th percentile at 9.3 L/m on a pressure of 8 cm with EPR of 3.   Today, 02/11/2015: He reports doing well. He is compliant with CPAP treatment. He is tolerating it well. He feels better when he uses it. His Crohn's is under control and stable. He had his checkup with his primary care physician and reports 2 new medical issues, with the exception of some lower extremity swelling which was worse in the summertime. He has not been able to lose any weight.  He likes to drink coffee and sodas, one or 2 per day. He does try to drink enough water as well. Tries to walk about 3 times a week, 2 miles each. Weight has been stable.  The patient's allergies, current medications, family history, past medical history, past social history, past surgical history and problem list were reviewed and updated as appropriate.    Previously:   I saw him on 07/31/2013, at which time he reported doing well. He had adjusted well to the mask and the pressure. He reported sleeping better with CPAP. He felt less tired during the day. His nocturia was improved somewhat as well. Overall, he  felt much better than before treatment.    I reviewed his compliance data from 12/30/2013 through 01/28/2014 which is a total of 30 days during which time he used his machine every night, percent used days greater than 4 hours was 83%, indicating very good compliance with an average usage of 5 hours and 7 minutes for all nights. Residual AHI was low at 0.7 per hour and leak was low at 5.5 L/m for the 95th percentile. Pressure at 8 cm with EPR of 3.   I first met him on 04/05/2013 at the request of his primary care physician, at which time he reported a long-standing history of snoring and witnessed apneas as well as nocturia, AM HAs and significant daytime somnolence. I suggested he return for sleep study. He had a split-night sleep study on 05/17/2013 and I went over his test results with him in detail today. His baseline sleep efficiency was mildly reduced at 82.8% with a latency to sleep of 6 minutes and wake after sleep onset of 22 minutes with mild sleep fragmentation noted. He had a mildly elevated arousal index. He had an increased percentage of stage I and stage II sleep, a absence of slow-wave sleep and 15.6% of REM sleep with a mildly prolonged REM latency. He had moderate to loud snoring. He had 83 obstructive hypopneas resulting in an AHI of 37 per hour. Face on oxygen saturation was 91% with a nadir of 71% in supine REM sleep. He was then titrated on CPAP. Sleep efficiency was 85.6% during the treatment portion of the study. Arousal index was improved. He had an increased percentage of stage II sleep, absence of slow-wave sleep and an increased percentage of REM sleep suggesting REM rebound. The average oxygen saturation was improved at 93%. Nadir was 86%. Time below 88% saturation was 54 seconds only. He was noted to have severe periodic leg movements of sleep during the treatment portion of the study with mild arousals at 4.7 per hour. He was titrated on CPAP from 5-8 cm with his AHI reduced to 0  events per hour at the final pressure with supine REM sleep achieved.   I reviewed the patient's CPAP compliance data from 06/26/2013 to 07/18/2013, which is a total of 23 days, during which time the patient used CPAP every day. The average usage for all days was 5 hours and 31 minutes. The percent used days greater than 4 hours was 78 %, indicating good compliance. The residual AHI was 1.2 per hour, indicating an appropriate treatment pressure of 8 cwp with EPR of 3. Air leak from the mask was low at 8 L per minute at the 95th percentile.   I reviewed his compliance data from 06/26/2013 to 07/30/2013, a total of 35 days during which time he uses CPAP every night. Percent used days greater than 4  hours was 80%, indicating good compliance. His average usage was 5 hours and 28 minutes, residual AHI low at 1.1, leak low at 7.3, pressure of the segment 8 with EPR of 3.  His Past Medical History Is Significant For: Past Medical History:  Diagnosis Date  . Crohn's disease ([place])   . OSA on CPAP 07/31/2013  . Pernicious anemia     His Past Surgical History Is Significant For: Past Surgical History:  Procedure Laterality Date  . BOWEL RESECTION     2    His Family History Is Significant For: Family History  Problem Relation Age of Onset  . Cancer Mother   . Cancer Father   . Heart failure Maternal Grandmother   . Cancer Maternal Grandfather   . Sleep apnea Father   . Cancer Paternal Grandmother   . Cancer Paternal Grandfather   . COPD Brother     His Social History Is Significant For: Social History   Socioeconomic History  . Marital status: Married    Spouse name: Threasa Beards  . Number of children: 4  . Years of education: 32  . Highest education level: None  Social Needs  . Financial resource strain: None  . Food insecurity - worry: None  . Food insecurity - inability: None  . Transportation needs - medical: None  . Transportation needs - non-medical: None  Occupational History     Comment: Attorney  Tobacco Use  . Smoking status: Former Smoker    Last attempt to quit: 09/16/1978    Years since quitting: 38.4  . Smokeless tobacco: Never Used  Substance and Sexual Activity  . Alcohol use: Yes    Alcohol/week: 1.8 oz    Types: 3 Standard drinks or equivalent per week    Comment: occas.  . Drug use: No  . Sexual activity: None  Other Topics Concern  . None  Social History Narrative   Patient consumes 1-2 cups caffeine daily    His Allergies Are:  No Known Allergies:   His Current Medications Are:  Outpatient Encounter Medications as of 02/28/2017  Medication Sig  . azaTHIOprine (IMURAN) 50 MG tablet Take 200 mg by mouth daily.  Marland Kitchen CALCIUM PO Take 600 mg by mouth 2 (two) times daily.   . Cholecalciferol (VITAMIN D3) 2000 UNITS TABS Take 2 tablets by mouth daily. Twice daily  . colesevelam (WELCHOL) 625 MG tablet Take 625 mg by mouth 2 (two) times daily with a meal.   . Cyanocobalamin (VITAMIN B 12) 100 MCG LOZG Place 1 tablet under the tongue daily.  . hydrocortisone 2.5 % cream Apply 1 application topically daily.  . Loperamide HCl (IMODIUM PO) Take 4 tablets by mouth 4 (four) times daily.  . Multiple Vitamin (MULTIVITAMIN) capsule Take 1 capsule by mouth daily.  Marland Kitchen omeprazole (PRILOSEC) 20 MG capsule Take 20 mg by mouth daily.  Marland Kitchen POTASSIUM PO Take 1 tablet by mouth daily.   . Probiotic Product (ALIGN) 4 MG CAPS Take 1 capsule by mouth daily.  . [DISCONTINUED] omeprazole (PRILOSEC) 10 MG capsule Take 10 mg by mouth daily.   . [DISCONTINUED] Testosterone (ANDROGEL TD) Place 1 application onto the skin daily.   No facility-administered encounter medications on file as of 02/28/2017.   :  Review of Systems:  Out of a complete 14 point review of systems, all are reviewed and negative with the exception of these symptoms as listed below: Review of Systems  Neurological:       Pt presents today to  discuss his cpap. Pt received a travel cpap from [place] but is  not sure if it is set at the correct pressure. Our office is unable to download it.    Objective:  Neurological Exam  Physical Exam Physical Examination:   Vitals:   02/28/17 0835  BP: 133/88  Pulse: 85    General Examination: The patient is a very pleasant 62 y.o. male in no acute distress. He appears well-developed and well-nourished and well groomed. Good spirits.  HEENT: Normocephalic, atraumatic, pupils are equal, round and reactive to light and accommodation. Extraocular tracking is good without limitation to gaze excursion or nystagmus noted. Normal smooth pursuit is noted. Hearing is grossly intact. Face is symmetric with normal facial animation and normal facial sensation. Speech is clear with no dysarthria noted. There is no hypophonia. There is no lip, neck/head, jaw or voice tremor. Neck is supple with full range of passive and active motion. There are no carotid bruits on auscultation. Oropharynx exam reveals: no significant mouth dryness, adequate dental hygiene and mild to moderate airway crowding, due to larger tongue and longer uvula with soft palate redundancy. Mallampati is class II. Tongue protrudes centrally and palate elevates symmetrically. Tonsils are 1+ in size.   Chest: Clear to auscultation without wheezing, rhonchi or crackles noted.  Heart: S1+S2+0, regular and normal without murmurs, rubs or gallops noted.   Abdomen: Soft, non-tender and non-distended with normal bowel sounds appreciated on auscultation.  Extremities: There is no obvious change.    Skin: Warm and dry without trophic changes noted.   Musculoskeletal: exam reveals no obvious joint deformities, tenderness or joint swelling or erythema.   Neurologically:  Mental status: The patient is awake, alert and oriented in all 4 spheres. His immediate and remote memory, attention, language skills and fund of knowledge are appropriate. There is no evidence of aphasia, agnosia, apraxia or anomia.  Speech is clear with normal prosody and enunciation. Thought process is linear. Mood is normal and affect is normal.  Cranial nerves II - XII are as described above under HEENT exam. In addition: shoulder shrug is normal with equal shoulder height noted. Motor exam: Normal bulk, strength and tone is noted. There is no drift, or tremor. Fine motor skills and coordination: grossly intact.  Cerebellar testing: No dysmetria or intention tremor. There is no truncal or gait ataxia.  Sensory exam: intact to light touch in the upper and lower extremities.  Gait, station and balance: He stands easily, slight increase in lumbar kyphosis is noted. Posture is age-appropriate, walking is unremarkable and he is able to do tandem walk without problems.          Assessment and Plan:   In summary, Scott [name] is a very pleasant 62 year old male with an underlying medical history of hyperlipidemia, PE secondary to Crohn's disease (on Imuran), s/p multiple colon resection surgeries, chronic anemia, rosacea, vitamin D deficiency, low testosterone and obesity, who presents for followup consultation of his obstructive sleep apnea. He has a history of severe obstructive sleep apnea, has been fully compliant with CPAP therapy and treatment settings are adequate at this time with an overall AHI around 1 per hour, leak acceptable. We can talked about trying to achieve sleep. He has overall gotten a little bit better compared to last year. He is trying to stay active and well-hydrated. Crohn's has been stable thankfully. He has received updated supplies on a regular basis. He is also using a travel CPAP, hence the gap on the  download recently. He is advised to follow-up in one year, we did talk about the importance of weight management. He can follow-up with one of our nurse practitioners. He is in agreement. I answered all his questions today.  I spent 15 minutes in total face-to-face time with the patient, more than 50%  of which was spent in counseling and coordination of care, reviewing test results, reviewing medication and discussing or reviewing the diagnosis of OSA, its prognosis and treatment options. Pertinent laboratory and imaging test results that were available during this visit with the patient were reviewed by me and considered in my medical decision making (see chart for details).

## 2017-04-06 DIAGNOSIS — K5080 Crohn's disease of both small and large intestine without complications: Secondary | ICD-10-CM | POA: Diagnosis not present

## 2017-04-10 DIAGNOSIS — K5080 Crohn's disease of both small and large intestine without complications: Secondary | ICD-10-CM | POA: Diagnosis not present

## 2017-04-10 DIAGNOSIS — Z980 Intestinal bypass and anastomosis status: Secondary | ICD-10-CM | POA: Diagnosis not present

## 2017-04-10 DIAGNOSIS — K5289 Other specified noninfective gastroenteritis and colitis: Secondary | ICD-10-CM | POA: Diagnosis not present

## 2017-04-10 DIAGNOSIS — D126 Benign neoplasm of colon, unspecified: Secondary | ICD-10-CM | POA: Diagnosis not present

## 2017-04-12 DIAGNOSIS — K5080 Crohn's disease of both small and large intestine without complications: Secondary | ICD-10-CM | POA: Diagnosis not present

## 2017-04-12 DIAGNOSIS — D126 Benign neoplasm of colon, unspecified: Secondary | ICD-10-CM | POA: Diagnosis not present

## 2017-04-27 DIAGNOSIS — G4733 Obstructive sleep apnea (adult) (pediatric): Secondary | ICD-10-CM | POA: Diagnosis not present

## 2017-06-20 DIAGNOSIS — E291 Testicular hypofunction: Secondary | ICD-10-CM | POA: Diagnosis not present

## 2017-07-31 DIAGNOSIS — G4733 Obstructive sleep apnea (adult) (pediatric): Secondary | ICD-10-CM | POA: Diagnosis not present

## 2017-08-30 DIAGNOSIS — Z0100 Encounter for examination of eyes and vision without abnormal findings: Secondary | ICD-10-CM | POA: Diagnosis not present

## 2017-09-05 DIAGNOSIS — R82998 Other abnormal findings in urine: Secondary | ICD-10-CM | POA: Diagnosis not present

## 2017-09-05 DIAGNOSIS — E559 Vitamin D deficiency, unspecified: Secondary | ICD-10-CM | POA: Diagnosis not present

## 2017-09-05 DIAGNOSIS — R7309 Other abnormal glucose: Secondary | ICD-10-CM | POA: Diagnosis not present

## 2017-09-05 DIAGNOSIS — E538 Deficiency of other specified B group vitamins: Secondary | ICD-10-CM | POA: Diagnosis not present

## 2017-09-05 DIAGNOSIS — Z0000 Encounter for general adult medical examination without abnormal findings: Secondary | ICD-10-CM | POA: Diagnosis not present

## 2017-09-05 DIAGNOSIS — E291 Testicular hypofunction: Secondary | ICD-10-CM | POA: Diagnosis not present

## 2017-09-12 DIAGNOSIS — Z79899 Other long term (current) drug therapy: Secondary | ICD-10-CM | POA: Diagnosis not present

## 2017-09-12 DIAGNOSIS — Z0000 Encounter for general adult medical examination without abnormal findings: Secondary | ICD-10-CM | POA: Diagnosis not present

## 2017-09-12 DIAGNOSIS — Z1389 Encounter for screening for other disorder: Secondary | ICD-10-CM | POA: Diagnosis not present

## 2017-09-12 DIAGNOSIS — G4733 Obstructive sleep apnea (adult) (pediatric): Secondary | ICD-10-CM | POA: Diagnosis not present

## 2017-09-12 DIAGNOSIS — Z87891 Personal history of nicotine dependence: Secondary | ICD-10-CM | POA: Diagnosis not present

## 2017-09-12 DIAGNOSIS — K219 Gastro-esophageal reflux disease without esophagitis: Secondary | ICD-10-CM | POA: Diagnosis not present

## 2017-10-27 DIAGNOSIS — Z23 Encounter for immunization: Secondary | ICD-10-CM | POA: Diagnosis not present

## 2017-10-31 DIAGNOSIS — G4733 Obstructive sleep apnea (adult) (pediatric): Secondary | ICD-10-CM | POA: Diagnosis not present

## 2018-01-02 DIAGNOSIS — E291 Testicular hypofunction: Secondary | ICD-10-CM | POA: Diagnosis not present

## 2018-01-02 DIAGNOSIS — N5201 Erectile dysfunction due to arterial insufficiency: Secondary | ICD-10-CM | POA: Diagnosis not present

## 2018-01-02 DIAGNOSIS — N400 Enlarged prostate without lower urinary tract symptoms: Secondary | ICD-10-CM | POA: Diagnosis not present

## 2018-02-01 DIAGNOSIS — G4733 Obstructive sleep apnea (adult) (pediatric): Secondary | ICD-10-CM | POA: Diagnosis not present

## 2018-02-27 ENCOUNTER — Telehealth: Payer: Self-pay | Admitting: *Deleted

## 2018-02-27 NOTE — Telephone Encounter (Signed)
Spoke with patient and informed him his FU tomorrow needs to be rescheduled with Shanda Bumps, NP. He agreed;l rescheduled and gave him date, time. Patient verbalized understanding, appreciation.

## 2018-02-28 ENCOUNTER — Ambulatory Visit: Payer: BLUE CROSS/BLUE SHIELD | Admitting: Adult Health

## 2018-03-21 ENCOUNTER — Encounter: Payer: Self-pay | Admitting: Neurology

## 2018-03-22 DIAGNOSIS — K5080 Crohn's disease of both small and large intestine without complications: Secondary | ICD-10-CM | POA: Diagnosis not present

## 2018-03-28 ENCOUNTER — Telehealth: Payer: Self-pay

## 2018-03-28 ENCOUNTER — Encounter: Payer: Self-pay | Admitting: Adult Health

## 2018-03-28 ENCOUNTER — Ambulatory Visit: Payer: Self-pay | Admitting: Adult Health

## 2018-03-28 NOTE — Telephone Encounter (Signed)
Patient was a no call/no show for their appointment today.   

## 2018-03-28 NOTE — Progress Notes (Deleted)
Subjective:    Patient ID: Scott [name] is a 63 y.o. male.  HPI     Interim history:  Today, 03/28/18, Scott [name] is a 64 year old male who is being seen today for follow-up of OSA on CPAP and CPAP compliance review.  Upon review of his compliance report he has excellent compliance with 30 out of 30 usage days and 28 days greater than 4 hours for compliance rate of 93%.  Average usage 5 hours and 58 minutes.  Residual AHI 1.0 with a set pressure 8 cm H2O and EPR level 3.  Leaks in the 95th percentile 9.1 L/min.  He reports he continues to do well and is tolerating continued use of CPAP.     HISTORY 02/28/2017 copied from Dr. Teofilo Pod visit: Scott [name], is a very pleasant 63 year old right-handed gentleman with an underlying medical history of hyperlipidemia, PE secondary to Crohn's disease (on Imuran), s/p multiple colon resection surgeries, chronic anemia, rosacea, vitamin D deficiency, low testosterone and  obesity, who presents for followup consultation of his obstructive sleep apnea, on treatment with CPAP. The patient is unaccompanied today. I last saw him on 02/11/2016 at which time he was doing well, compliant with his CPAP.   Today, 02/28/17: I reviewed his CPAP compliance data from 01/28/2017 through 02/26/2017 which is a total of 30 days, during which time he used his home CPAP 18 days but also his travel CPAP. On the home CPAP his average usage was 5 hours and 47 minutes, residual AHI at goal at 1.1 per hour, leak acceptable with the 95th percentile at 9.3 L/m on a pressure of 8 cm with EPR of 3. A download is not possible from his travel CPAP. He is not sure if it is that at the same pressure level as his home CPAP. Of note, I provided him with a travel CPAP prescription last year in December, it was supposed to be set at the same pressure level as his regular CPAP. He is advised to double check with his DME company. He reports doing well, compliant with CPAP, no recent  illness, no major changes to medical Hx of medications, no longer on testosterone. Crohn's stable. Tries to hydrate well with water.    His Past Medical History Is Significant For: Past Medical History:  Diagnosis Date  . Crohn's disease ([facility])   . OSA on CPAP 07/31/2013  . Pernicious anemia     His Past Surgical History Is Significant For: Past Surgical History:  Procedure Laterality Date  . BOWEL RESECTION     2    His Family History Is Significant For: Family History  Problem Relation Age of Onset  . Cancer Mother   . Cancer Father   . Heart failure Maternal Grandmother   . Cancer Maternal Grandfather   . Sleep apnea Father   . Cancer Paternal Grandmother   . Cancer Paternal Grandfather   . COPD Brother     His Social History Is Significant For: Social History   Socioeconomic History  . Marital status: Married    Spouse name: Shawna Orleans  . Number of children: 4  . Years of education: 65  . Highest education level: Not on file  Occupational History    Comment: Attorney  Social Needs  . Financial resource strain: Not on file  . Food insecurity:    Worry: Not on file    Inability: Not on file  . Transportation needs:    Medical: Not on file    Non-medical:  Not on file  Tobacco Use  . Smoking status: Former Smoker    Last attempt to quit: 09/16/1978    Years since quitting: 39.5  . Smokeless tobacco: Never Used  Substance and Sexual Activity  . Alcohol use: Yes    Alcohol/week: 3.0 standard drinks    Types: 3 Standard drinks or equivalent per week    Comment: occas.  . Drug use: No  . Sexual activity: Not on file  Lifestyle  . Physical activity:    Days per week: Not on file    Minutes per session: Not on file  . Stress: Not on file  Relationships  . Social connections:    Talks on phone: Not on file    Gets together: Not on file    Attends religious service: Not on file    Active member of club or organization: Not on file    Attends meetings of clubs  or organizations: Not on file    Relationship status: Not on file  Other Topics Concern  . Not on file  Social History Narrative   Patient consumes 1-2 cups caffeine daily    His Allergies Are:  No Known Allergies:   His Current Medications Are:  Outpatient Encounter Medications as of 03/28/2018  Medication Sig  . azaTHIOprine (IMURAN) 50 MG tablet Take 200 mg by mouth daily.  Marland Kitchen CALCIUM PO Take 600 mg by mouth 2 (two) times daily.   . Cholecalciferol (VITAMIN D3) 2000 UNITS TABS Take 2 tablets by mouth daily. Twice daily  . colesevelam (WELCHOL) 625 MG tablet Take 625 mg by mouth 2 (two) times daily with a meal.   . Cyanocobalamin (VITAMIN B 12) 100 MCG LOZG Place 1 tablet under the tongue daily.  . hydrocortisone 2.5 % cream Apply 1 application topically daily.  . Loperamide HCl (IMODIUM PO) Take 4 tablets by mouth 4 (four) times daily.  . Multiple Vitamin (MULTIVITAMIN) capsule Take 1 capsule by mouth daily.  Marland Kitchen omeprazole (PRILOSEC) 20 MG capsule Take 20 mg by mouth daily.  Marland Kitchen POTASSIUM PO Take 1 tablet by mouth daily.   . Probiotic Product (ALIGN) 4 MG CAPS Take 1 capsule by mouth daily.   No facility-administered encounter medications on file as of 03/28/2018.   :  Review of Systems:  Out of a complete 14 point review of systems, all are reviewed and negative with the exception of these symptoms as listed below: Review of Systems    Objective:  Neurological Exam  Physical Exam Physical Examination:   There were no vitals filed for this visit.  General Examination: The patient is a very pleasant 63 y.o. male in no acute distress. He appears well-developed and well-nourished and well groomed. Good spirits.  HEENT: Normocephalic, atraumatic, pupils are equal, round and reactive to light and accommodation. Extraocular tracking is good without limitation to gaze excursion or nystagmus noted. Normal smooth pursuit is noted. Hearing is grossly intact. Face is symmetric with  normal facial animation and normal facial sensation. Speech is clear with no dysarthria noted. There is no hypophonia. There is no lip, neck/head, jaw or voice tremor. Neck is supple with full range of passive and active motion. There are no carotid bruits on auscultation. Oropharynx exam reveals: no significant mouth dryness, adequate dental hygiene and mild to moderate airway crowding, due to larger tongue and longer uvula with soft palate redundancy. Mallampati is class II. Tongue protrudes centrally and palate elevates symmetrically. Tonsils are 1+ in size.   Chest: Clear  to auscultation without wheezing, rhonchi or crackles noted.  Heart: S1+S2+0, regular and normal without murmurs, rubs or gallops noted.   Abdomen: Soft, non-tender and non-distended with normal bowel sounds appreciated on auscultation.  Extremities: There is no obvious change.    Skin: Warm and dry without trophic changes noted.   Musculoskeletal: exam reveals no obvious joint deformities, tenderness or joint swelling or erythema.   Neurologically:  Mental status: The patient is awake, alert and oriented in all 4 spheres. His immediate and remote memory, attention, language skills and fund of knowledge are appropriate. There is no evidence of aphasia, agnosia, apraxia or anomia. Speech is clear with normal prosody and enunciation. Thought process is linear. Mood is normal and affect is normal.  Cranial nerves II - XII are as described above under HEENT exam. In addition: shoulder shrug is normal with equal shoulder height noted. Motor exam: Normal bulk, strength and tone is noted. There is no drift, or tremor. Fine motor skills and coordination: grossly intact.  Cerebellar testing: No dysmetria or intention tremor. There is no truncal or gait ataxia.  Sensory exam: intact to light touch in the upper and lower extremities.  Gait, station and balance: He stands easily, slight increase in lumbar kyphosis is noted.  Posture is age-appropriate, walking is unremarkable and he is able to do tandem walk without problems.          Assessment and Plan:   Scott [name] is a 63 y.o. male with underlying medical history of HLD, PE secondary to Crohn's disease, status post multiple colon resection surgeries, chronic anemia, rosacea, vitamin D deficiency, low testosterone, obesity and OSA on CPAP.  He continues to have excellent compliance with CPAP use with adequate OSA management.  Continue current settings is no indication for changes at this time.  He does not need any updated supplies at this time as he regularly receives them from his DME company [employer].  He will return in 1 year with Aundra Millet, NP or call earlier if needed  Greater than 50% of this 15-minute visit was spent reviewing CPAP download and answering questions related to OSA on CPAP  George Hugh, AGNP-BC  [facility] [address] [place], [place] [address]  Phone [phone] Fax [phone] Note: This document was prepared with digital dictation and possible smart phrase technology. Any transcriptional errors that result from this process are unintentional.

## 2018-05-02 DIAGNOSIS — G4733 Obstructive sleep apnea (adult) (pediatric): Secondary | ICD-10-CM | POA: Diagnosis not present

## 2018-05-23 ENCOUNTER — Telehealth: Payer: Self-pay | Admitting: Neurology

## 2018-05-23 NOTE — Telephone Encounter (Signed)
Due to current COVID 19 pandemic, our office is severely reducing in office visits until further notice, in order to minimize the risk to our patients and healthcare providers.   Called patient and offered him a sooner appointment via virtual visit with Dr. Frances Furbish. Patient accepted and verbalized understanding of the steps in the webex process. Patient understands that he will receive an e-mail with directions as well as the link for connection. Patient understands that he will be called by RN and front office staff prior to appointment.  Pt understands that although there may be some limitations with this type of visit, we will take all precautions to reduce any security or privacy concerns.  Pt understands that this will be treated like an in office visit and we will file with pt's insurance, and there may be a patient responsible charge related to this service.  Pt's email is [email or web address]. Pt understands that the [employer] webex software must be downloaded and operational on the device pt plans to use for the visit.

## 2018-05-28 DIAGNOSIS — Z85828 Personal history of other malignant neoplasm of skin: Secondary | ICD-10-CM | POA: Diagnosis not present

## 2018-05-28 DIAGNOSIS — L821 Other seborrheic keratosis: Secondary | ICD-10-CM | POA: Diagnosis not present

## 2018-05-28 DIAGNOSIS — L820 Inflamed seborrheic keratosis: Secondary | ICD-10-CM | POA: Diagnosis not present

## 2018-05-28 DIAGNOSIS — D1801 Hemangioma of skin and subcutaneous tissue: Secondary | ICD-10-CM | POA: Diagnosis not present

## 2018-05-28 DIAGNOSIS — L570 Actinic keratosis: Secondary | ICD-10-CM | POA: Diagnosis not present

## 2018-05-30 ENCOUNTER — Encounter: Payer: Self-pay | Admitting: Adult Health

## 2018-05-31 NOTE — Telephone Encounter (Signed)
I called pt. Pt's meds, allergies, and PMH were updated.  Pt has no questions prior to his appt on Monday.

## 2018-06-04 ENCOUNTER — Ambulatory Visit (INDEPENDENT_AMBULATORY_CARE_PROVIDER_SITE_OTHER): Payer: BLUE CROSS/BLUE SHIELD | Admitting: Neurology

## 2018-06-04 ENCOUNTER — Encounter: Payer: Self-pay | Admitting: Neurology

## 2018-06-04 ENCOUNTER — Other Ambulatory Visit: Payer: Self-pay

## 2018-06-04 DIAGNOSIS — Z9989 Dependence on other enabling machines and devices: Secondary | ICD-10-CM

## 2018-06-04 DIAGNOSIS — G4733 Obstructive sleep apnea (adult) (pediatric): Secondary | ICD-10-CM | POA: Diagnosis not present

## 2018-06-04 NOTE — Progress Notes (Signed)
Order for cpap supplies sent to [facility] via fax. Confirmation received that the order transmitted was successful.  

## 2018-06-04 NOTE — Patient Instructions (Signed)
Given verbally, during today's virtual video-based encounter, with verbal feedback received.   

## 2018-06-04 NOTE — Progress Notes (Signed)
° °Interim history:  ° °Scott [name], is a very pleasant 62-year-old right-handed gentleman with an underlying medical history of hyperlipidemia, PE secondary to Crohn's disease (on Imuran), s/p multiple colon resection surgeries, chronic anemia, rosacea, vitamin D deficiency, low testosterone and  °obesity, who presents for a virtual, video based appt via webex for followup consultation of his obstructive sleep apnea, on treatment with CPAP. The patient is unaccompanied today and joins via laptop from his home, I am in my office. I last saw him on 02/28/2017, at which time he was stable and advised to follow-up in one year for sleep apnea recheck.  ° °Today, 06/04/2018: please also see below for virtual visit documentation. ° °I reviewed his CPAP compliance data from 05/01/2018 through 05/30/2018 which is a total of 30 days, during which time he used his machine every night with percent used days greater than 4 hours at 97%, indicating excellent compliance with an average usage of 5 hours and 55 minutes, residual AHI at goal at 1.2 per hour, leak acceptable with the 95th percentile at 11.7 L/m on a pressure of 8 cm with EPR.  °  °Previously: ° °I saw him on 02/11/2016 at which time he was doing well, compliant with his CPAP.  °  °I reviewed his CPAP compliance data from 01/28/2017 through 02/26/2017 which is a total of 30 days, during which time he used his home CPAP 18 days but also his travel CPAP. On the home CPAP his average usage was 5 hours and 47 minutes, residual AHI at goal at 1.1 per hour, leak acceptable with the 95th percentile at 9.3 L/m on a pressure of 8 cm with EPR of 3.  ° ° °I saw him on 02/11/2015, at which time he reported doing well, he was compliant with CPAP therapy. He is Crohn's disease was stable. He was trying to lose weight but had not lost much in the way of weight. °  °I reviewed his CPAP compliance data from 01/11/2016 through 02/09/2016 which is a total of 30 days, during which  time he used his machine every night with percent used days greater than 4 hours at 87%, indicating very good compliance with an average usage of 5 hours and 12 minutes, residual AHI 0.8 per hour, leak acceptable with the 95th percentile at 13.5 L/m on a pressure of 8 cm with EPR of 3. °  °I saw him on 02/06/2014, at which time he was compliant with CPAP therapy he had reported doing well. He was not sleeping enough. He was working full-time as an attorney. Overall, he felt improved on CPAP therapy and was encouraged to continue to stay compliant with treatment and allow for enough sleep time. °  °I reviewed his CPAP compliance data from 01/10/2015 through 02/08/2015 which is a total of 30 days during which time he used his machine every night with percent used days greater than 4 hours at 93%, indicating excellent compliance with an average usage of 5 hours and 25 minutes, residual AHI at 1.2 per hour, leak for the most part low with the 95th percentile at 9.3 L/m on a pressure of 8 cm with EPR of 3. °  °Today, 02/11/2015: He reports doing well. He is compliant with CPAP treatment. He is tolerating it well. He feels better when he uses it. His Crohn's is under control and stable. He had his checkup with his primary care physician and reports 2 new medical issues, with the exception of some lower extremity   swelling which was worse in the summertime. He has not been able to lose any weight. He likes to drink coffee and sodas, one or 2 per day. He does try to drink enough water as well. Tries to walk about 3 times a week, 2 miles each. Weight has been stable. °  °The patient's allergies, current medications, family history, past medical history, past social history, past surgical history and problem list were reviewed and updated as appropriate.  °  °Previously: °  °I saw him on 07/31/2013, at which time he reported doing well. He had adjusted well to the mask and the pressure. He reported sleeping better with CPAP.  He felt less tired during the day. His nocturia was improved somewhat as well. Overall, he felt much better than before treatment.  °  °I reviewed his compliance data from 12/30/2013 through 01/28/2014 which is a total of 30 days during which time he used his machine every night, percent used days greater than 4 hours was 83%, indicating very good compliance with an average usage of 5 hours and 7 minutes for all nights. Residual AHI was low at 0.7 per hour and leak was low at 5.5 L/m for the 95th percentile. Pressure at 8 cm with EPR of 3. °  °I first met him on 04/05/2013 at the request of his primary care physician, at which time he reported a long-standing history of snoring and witnessed apneas as well as nocturia, AM HAs and significant daytime somnolence. I suggested he return for sleep study. He had a split-night sleep study on 05/17/2013 and I went over his test results with him in detail today. His baseline sleep efficiency was mildly reduced at 82.8% with a latency to sleep of 6 minutes and wake after sleep onset of 22 minutes with mild sleep fragmentation noted. He had a mildly elevated arousal index. He had an increased percentage of stage I and stage II sleep, a absence of slow-wave sleep and 15.6% of REM sleep with a mildly prolonged REM latency. He had moderate to loud snoring. He had 83 obstructive hypopneas resulting in an AHI of 37 per hour. Face on oxygen saturation was 91% with a nadir of 71% in supine REM sleep. He was then titrated on CPAP. Sleep efficiency was 85.6% during the treatment portion of the study. Arousal index was improved. He had an increased percentage of stage II sleep, absence of slow-wave sleep and an increased percentage of REM sleep suggesting REM rebound. The average oxygen saturation was improved at 93%. Nadir was 86%. Time below 88% saturation was 54 seconds only. He was noted to have severe periodic leg movements of sleep during the treatment portion of the study with  mild arousals at 4.7 per hour. He was titrated on CPAP from 5-8 cm with his AHI reduced to 0 events per hour at the final pressure with supine REM sleep achieved. °  °I reviewed the patient's CPAP compliance data from 06/26/2013 to 07/18/2013, which is a total of 23 days, during which time the patient used CPAP every day. The average usage for all days was 5 hours and 31 minutes. The percent used days greater than 4 hours was 78 %, indicating good compliance. The residual AHI was 1.2 per hour, indicating an appropriate treatment pressure of 8 cwp with EPR of 3. Air leak from the mask was low at 8 L per minute at the 95th percentile. °  °I reviewed his compliance data from 06/26/2013 to 07/30/2013, a total   of 35 days during which time he uses CPAP every night. Percent used days greater than 4 hours was 80%, indicating good compliance. His average usage was 5 hours and 28 minutes, residual AHI low at 1.1, leak low at 7.3, pressure of the segment 8 with EPR of 3. ° °His Past Medical History Is Significant For: °Past Medical History:  °Diagnosis Date  °• Crohn's disease (HCC)   °• OSA on CPAP 07/31/2013  °• Pernicious anemia   ° ° °His Past Surgical History Is Significant For: °Past Surgical History:  °Procedure Laterality Date  °• BOWEL RESECTION    ° 2  ° ° °His Family History Is Significant For: °Family History  °Problem Relation Age of Onset  °• Cancer Mother   °• Cancer Father   °• Heart failure Maternal Grandmother   °• Cancer Maternal Grandfather   °• Sleep apnea Father   °• Cancer Paternal Grandmother   °• Cancer Paternal Grandfather   °• COPD Brother   ° ° °His Social History Is Significant For: °Social History  ° °Socioeconomic History  °• Marital status: Married  °  Spouse name: Melanie  °• Number of children: 4  °• Years of education: 19  °• Highest education level: Not on file  °Occupational History  °  Comment: Attorney  °Social Needs  °• Financial resource strain: Not on file  °• Food insecurity:  °  Worry:  Not on file  °  Inability: Not on file  °• Transportation needs:  °  Medical: Not on file  °  Non-medical: Not on file  °Tobacco Use  °• Smoking status: Former Smoker  °  Last attempt to quit: 09/16/1978  °  Years since quitting: 39.7  °• Smokeless tobacco: Never Used  °Substance and Sexual Activity  °• Alcohol use: Yes  °  Alcohol/week: 3.0 standard drinks  °  Types: 3 Standard drinks or equivalent per week  °  Comment: occas.  °• Drug use: No  °• Sexual activity: Not on file  °Lifestyle  °• Physical activity:  °  Days per week: Not on file  °  Minutes per session: Not on file  °• Stress: Not on file  °Relationships  °• Social connections:  °  Talks on phone: Not on file  °  Gets together: Not on file  °  Attends religious service: Not on file  °  Active member of club or organization: Not on file  °  Attends meetings of clubs or organizations: Not on file  °  Relationship status: Not on file  °Other Topics Concern  °• Not on file  °Social History Narrative  ° Patient consumes 1-2 cups caffeine daily  ° ° °His Allergies Are:  °No Known Allergies:  ° °His Current Medications Are:  °Outpatient Encounter Medications as of 06/04/2018  °Medication Sig  °• azaTHIOprine (IMURAN) 50 MG tablet Take 200 mg by mouth daily.  °• CALCIUM PO Take 600 mg by mouth 2 (two) times daily.   °• Cholecalciferol (VITAMIN D3) 2000 UNITS TABS Take 2 tablets by mouth daily. Twice daily  °• colesevelam (WELCHOL) 625 MG tablet Take 1,875 mg by mouth 2 (two) times daily with a meal.   °• Cyanocobalamin (VITAMIN B 12) 100 MCG LOZG Place 1 tablet under the tongue daily.  °• hydrocortisone 2.5 % cream Apply 1 application topically daily.  °• Loperamide HCl (IMODIUM PO) Take 4 tablets by mouth 4 (four) times daily.  °• Multiple Vitamin (MULTIVITAMIN) capsule Take   1 capsule by mouth daily.  °• omeprazole (PRILOSEC) 20 MG capsule Take 20 mg by mouth daily.  °• POTASSIUM PO Take 1 tablet by mouth daily.   °• Probiotic Product (ALIGN) 4 MG CAPS Take 1  capsule by mouth daily.  ° °No facility-administered encounter medications on file as of 06/04/2018.   °: ° °Review of Systems:  °Out of a complete 14 point review of systems, all are reviewed and negative with the exception of these symptoms as listed below: ° °Virtual Visit via Video Note on 06/04/2018: ° °I connected with Scott [name] on 06/04/18 at 10:30 AM EDT by a video enabled telemedicine application and verified that I am speaking with the correct person using two identifiers. °  °I discussed the limitations of evaluation and management by telemedicine and the availability of in person appointments. The patient expressed understanding and agreed to proceed. ° °History of Present Illness: °He reports doing well, up-to-date with his supplies, uses nasal pillows. DME company is [facility]. He is mindful about social distancing. He is currently working virtually from home, seeing his clients via zoom or webex. 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 °He sleeps well with his CPAP, no issues medically speaking, has the occasional mouth dryness but it is not a serious problem. °  °Observations/Objective: °On examination, he is in no acute distress, pleasant, conversant. Face is symmetric with normal facial animation noted. Extraocular movements are well preserved in all directions without nystagmus noted. Hearing is grossly intact. Speech is clear without dysarthria, hypophonia or voice tremor noted. Airway examination reveals stable findings, tongue protrudes centrally and palate elevates symmetrically, no obvious mouth dryness. Motor examination reveals no drift, fine motor skills well preserved in the upper extremities, no postural or action tremor. Cerebellar testing with finger to nose testing is unremarkable. He stands without difficulty, posture is normal. Romberg is negative. ° °Assessment and Plan: °In summary, Scott [name] is a very pleasant 62-year old male with an underlying medical history of  hyperlipidemia, PE secondary to Crohn's disease (on Imuran), s/p multiple colon resection surgeries, chronic anemia, rosacea, vitamin D deficiency, low testosterone and obesity, who presents for a virtual, video based followup appt for his obstructive sleep apnea. He has a history of severe obstructive sleep apnea, has been fully compliant with CPAP therapy and treatment settings are adequate at this time with an overall AHI around 1 per hour, leak acceptable. He has received updated supplies on a regular basis. He is also using a travel CPAP. He is advised to follow-up in one year, may follow-up with one of our nurse practitioners. He is in agreement.  ° °Follow Up Instructions: °1. Continue using CPAP regularly with full compliance, patient is commended for treatment adherence. °2. Follow-up yearly, with NP next time.  °3. CPAP supply order placed, will fax to DME company. °4. Call or email through My Chart for any interim questions or concerns. ° °  °I discussed the assessment and treatment plan with the patient. The patient was provided an opportunity to ask questions and all were answered. The patient agreed with the plan and demonstrated an understanding of the instructions. °  °The patient was advised to call back or seek an in-person evaluation if the symptoms worsen or if the condition fails to improve as anticipated. ° °I provided 15 minutes of non-face-to-face time during this encounter. ° ° ° , MD ° ° ° ° ° ° °

## 2018-06-14 DIAGNOSIS — K5080 Crohn's disease of both small and large intestine without complications: Secondary | ICD-10-CM | POA: Diagnosis not present

## 2018-06-21 ENCOUNTER — Ambulatory Visit: Payer: BLUE CROSS/BLUE SHIELD | Admitting: Adult Health

## 2018-06-21 ENCOUNTER — Telehealth: Payer: Self-pay | Admitting: Neurology

## 2018-06-21 NOTE — Telephone Encounter (Signed)
I spoke to the patient and made his yearly follow up with the NP. He understood his appointment time and day.

## 2018-08-01 DIAGNOSIS — G4733 Obstructive sleep apnea (adult) (pediatric): Secondary | ICD-10-CM | POA: Diagnosis not present

## 2018-08-30 DIAGNOSIS — G4733 Obstructive sleep apnea (adult) (pediatric): Secondary | ICD-10-CM | POA: Diagnosis not present

## 2018-09-12 DIAGNOSIS — Z0000 Encounter for general adult medical examination without abnormal findings: Secondary | ICD-10-CM | POA: Diagnosis not present

## 2018-09-12 DIAGNOSIS — Z125 Encounter for screening for malignant neoplasm of prostate: Secondary | ICD-10-CM | POA: Diagnosis not present

## 2018-09-12 DIAGNOSIS — R7309 Other abnormal glucose: Secondary | ICD-10-CM | POA: Diagnosis not present

## 2018-09-12 DIAGNOSIS — E559 Vitamin D deficiency, unspecified: Secondary | ICD-10-CM | POA: Diagnosis not present

## 2018-09-12 DIAGNOSIS — E538 Deficiency of other specified B group vitamins: Secondary | ICD-10-CM | POA: Diagnosis not present

## 2018-09-12 DIAGNOSIS — R7989 Other specified abnormal findings of blood chemistry: Secondary | ICD-10-CM | POA: Diagnosis not present

## 2018-10-01 DIAGNOSIS — E538 Deficiency of other specified B group vitamins: Secondary | ICD-10-CM | POA: Diagnosis not present

## 2018-10-01 DIAGNOSIS — Z0000 Encounter for general adult medical examination without abnormal findings: Secondary | ICD-10-CM | POA: Diagnosis not present

## 2018-10-01 DIAGNOSIS — K5090 Crohn's disease, unspecified, without complications: Secondary | ICD-10-CM | POA: Diagnosis not present

## 2018-10-01 DIAGNOSIS — Z1331 Encounter for screening for depression: Secondary | ICD-10-CM | POA: Diagnosis not present

## 2018-10-01 DIAGNOSIS — Z9049 Acquired absence of other specified parts of digestive tract: Secondary | ICD-10-CM | POA: Diagnosis not present

## 2018-10-01 DIAGNOSIS — Z79899 Other long term (current) drug therapy: Secondary | ICD-10-CM | POA: Diagnosis not present

## 2018-10-11 DIAGNOSIS — R82998 Other abnormal findings in urine: Secondary | ICD-10-CM | POA: Diagnosis not present

## 2018-10-11 DIAGNOSIS — R7309 Other abnormal glucose: Secondary | ICD-10-CM | POA: Diagnosis not present

## 2018-11-01 DIAGNOSIS — Z23 Encounter for immunization: Secondary | ICD-10-CM | POA: Diagnosis not present

## 2018-11-13 DIAGNOSIS — H25013 Cortical age-related cataract, bilateral: Secondary | ICD-10-CM | POA: Diagnosis not present

## 2018-11-13 DIAGNOSIS — H52203 Unspecified astigmatism, bilateral: Secondary | ICD-10-CM | POA: Diagnosis not present

## 2018-11-13 DIAGNOSIS — H5213 Myopia, bilateral: Secondary | ICD-10-CM | POA: Diagnosis not present

## 2019-01-02 DIAGNOSIS — N401 Enlarged prostate with lower urinary tract symptoms: Secondary | ICD-10-CM | POA: Diagnosis not present

## 2019-01-02 DIAGNOSIS — R350 Frequency of micturition: Secondary | ICD-10-CM | POA: Diagnosis not present

## 2019-01-09 DIAGNOSIS — R350 Frequency of micturition: Secondary | ICD-10-CM | POA: Diagnosis not present

## 2019-01-09 DIAGNOSIS — N401 Enlarged prostate with lower urinary tract symptoms: Secondary | ICD-10-CM | POA: Diagnosis not present

## 2019-01-09 DIAGNOSIS — E291 Testicular hypofunction: Secondary | ICD-10-CM | POA: Diagnosis not present

## 2019-02-04 DIAGNOSIS — G4733 Obstructive sleep apnea (adult) (pediatric): Secondary | ICD-10-CM | POA: Diagnosis not present

## 2019-02-19 ENCOUNTER — Ambulatory Visit: Payer: BC Managed Care – PPO | Attending: Internal Medicine

## 2019-02-19 ENCOUNTER — Other Ambulatory Visit: Payer: Self-pay

## 2019-02-19 DIAGNOSIS — Z23 Encounter for immunization: Secondary | ICD-10-CM | POA: Insufficient documentation

## 2019-02-19 NOTE — Progress Notes (Signed)
   Covid-19 Vaccination Clinic  Name:  Scott [name]    MRN: [id number] DOB: 1955/07/05  02/19/2019  [name] was observed post Covid-19 immunization for 15 minutes without incidence. He was provided with Vaccine Information Sheet and instruction to access the V-Safe system.   [name] was instructed to call 911 with any severe reactions post vaccine: Marland Kitchen Difficulty breathing  . Swelling of your face and throat  . A fast heartbeat  . A bad rash all over your body  . Dizziness and weakness    Immunizations Administered    Name Date Dose VIS Date Route   Pfizer COVID-19 Vaccine 02/19/2019  1:03 AM 0.3 mL 01/11/2019 Intramuscular   Manufacturer: [employer], [employer]   Lot: [id number]   NDC: [address]-5

## 2019-03-11 ENCOUNTER — Ambulatory Visit: Payer: BC Managed Care – PPO | Attending: Internal Medicine

## 2019-03-11 DIAGNOSIS — Z23 Encounter for immunization: Secondary | ICD-10-CM | POA: Insufficient documentation

## 2019-03-11 NOTE — Progress Notes (Signed)
   Covid-19 Vaccination Clinic  Name:  YUSUKE BEZA    MRN: [id number] DOB: [birth year]  03/11/2019  Mr. Wilbon was observed post Covid-19 immunization for 15 minutes without incidence. He was provided with Vaccine Information Sheet and instruction to access the V-Safe system.   Mr. Goodell was instructed to call 911 with any severe reactions post vaccine: Marland Kitchen Difficulty breathing  . Swelling of your face and throat  . A fast heartbeat  . A bad rash all over your body  . Dizziness and weakness    Immunizations Administered    Name Date Dose VIS Date Route   Pfizer COVID-19 Vaccine 03/11/2019 12:53 PM 0.3 mL 01/11/2019 Intramuscular   Manufacturer: [employer], [employer]   Lot: [id number]   NDC: [address]-3

## 2019-05-06 DIAGNOSIS — G4733 Obstructive sleep apnea (adult) (pediatric): Secondary | ICD-10-CM | POA: Diagnosis not present

## 2019-05-07 DIAGNOSIS — G4733 Obstructive sleep apnea (adult) (pediatric): Secondary | ICD-10-CM | POA: Diagnosis not present

## 2019-05-13 DIAGNOSIS — K5080 Crohn's disease of both small and large intestine without complications: Secondary | ICD-10-CM | POA: Diagnosis not present

## 2019-05-20 ENCOUNTER — Telehealth: Payer: Self-pay | Admitting: Adult Health

## 2019-05-20 NOTE — Telephone Encounter (Signed)
Lvm to r/s appointment on 05/05 due to provider being out.

## 2019-05-22 ENCOUNTER — Telehealth: Payer: Self-pay | Admitting: Adult Health

## 2019-05-22 NOTE — Telephone Encounter (Signed)
I called patient and LVM to reschedule 5/5 appointment due to NP out.

## 2019-05-29 DIAGNOSIS — Z85828 Personal history of other malignant neoplasm of skin: Secondary | ICD-10-CM | POA: Diagnosis not present

## 2019-05-29 DIAGNOSIS — L821 Other seborrheic keratosis: Secondary | ICD-10-CM | POA: Diagnosis not present

## 2019-05-29 DIAGNOSIS — L570 Actinic keratosis: Secondary | ICD-10-CM | POA: Diagnosis not present

## 2019-05-29 DIAGNOSIS — D1801 Hemangioma of skin and subcutaneous tissue: Secondary | ICD-10-CM | POA: Diagnosis not present

## 2019-05-30 ENCOUNTER — Ambulatory Visit (INDEPENDENT_AMBULATORY_CARE_PROVIDER_SITE_OTHER): Payer: BC Managed Care – PPO | Admitting: Adult Health

## 2019-05-30 ENCOUNTER — Other Ambulatory Visit: Payer: Self-pay

## 2019-05-30 ENCOUNTER — Encounter: Payer: Self-pay | Admitting: Adult Health

## 2019-05-30 VITALS — BP 127/82 | HR 65 | Temp 97.0°F | Ht 75.0 in | Wt 265.0 lb

## 2019-05-30 DIAGNOSIS — Z9989 Dependence on other enabling machines and devices: Secondary | ICD-10-CM

## 2019-05-30 DIAGNOSIS — G4733 Obstructive sleep apnea (adult) (pediatric): Secondary | ICD-10-CM

## 2019-05-30 NOTE — Progress Notes (Addendum)
PATIENT: Scott [name] DOB: 03/26/1955  REASON FOR VISIT: follow up HISTORY FROM: patient  HISTORY OF PRESENT ILLNESS: Today 05/30/19:  Scott [name] is a 63 year old male with a history of obstructive sleep apnea on CPAP.  His download indicates that he use his machine nightly for compliance of 100%.  He uses machine greater than 4 hours 20 out of 30 days for compliance of 67%.  Her residual AHI is 1 on 8 cm of water with EPR 3.  Leak in the 95th percentile is 12.8 L/min.  He reports that the CPAP is working well for him.  Reports that he tends to stay up too late and therefore he does not use a CPAP for very long some nights.   HISTORY (copied from Scott [name] note) 02/28/17: I reviewed his CPAP compliance data from 01/28/2017 through 02/26/2017 which is a total of 30 days, during which time he used his home CPAP 18 days but also his travel CPAP. On the home CPAP his average usage was 5 hours and 47 minutes, residual AHI at goal at 1.1 per hour, leak acceptable with the 95th percentile at 9.3 L/m on a pressure of 8 cm with EPR of 3. A download is not possible from his travel CPAP. He is not sure if it is that at the same pressure level as his home CPAP. Of note, I provided him with a travel CPAP prescription last year in December, it was supposed to be set at the same pressure level as his regular CPAP. He is advised to double check with his DME company. He reports doing well, compliant with CPAP, no recent illness, no major changes to medical Hx of medications, no longer on testosterone. Crohn's stable. Tries to hydrate well with water.   REVIEW OF SYSTEMS: Out of a complete 14 system review of symptoms, the patient complains only of the following symptoms, and all other reviewed systems are negative.   ESS 16  ALLERGIES: No Known Allergies  HOME MEDICATIONS: Outpatient Medications Prior to Visit  Medication Sig Dispense Refill  . azaTHIOprine (IMURAN) 50 MG tablet Take 200  mg by mouth daily.    Marland Kitchen CALCIUM PO Take 600 mg by mouth 2 (two) times daily.     . Cholecalciferol (VITAMIN D3) 2000 UNITS TABS Take 2 tablets by mouth daily. Twice daily    . colesevelam (WELCHOL) 625 MG tablet Take 1,875 mg by mouth 2 (two) times daily with a meal.     . Cyanocobalamin (VITAMIN B 12) 100 MCG LOZG Place 1 tablet under the tongue daily.    . hydrocortisone 2.5 % cream Apply 1 application topically daily.    . Loperamide HCl (IMODIUM PO) Take 4 tablets by mouth 4 (four) times daily.    . Multiple Vitamin (MULTIVITAMIN) capsule Take 1 capsule by mouth daily.    Marland Kitchen omeprazole (PRILOSEC) 20 MG capsule Take 20 mg by mouth daily.    Marland Kitchen POTASSIUM PO Take 1 tablet by mouth daily.     . Probiotic Product (ALIGN) 4 MG CAPS Take 1 capsule by mouth daily.     No facility-administered medications prior to visit.    PAST MEDICAL HISTORY: Past Medical History:  Diagnosis Date  . Crohn's disease ([place])   . OSA on CPAP 07/31/2013  . Pernicious anemia     PAST SURGICAL HISTORY: Past Surgical History:  Procedure Laterality Date  . BOWEL RESECTION     2    FAMILY HISTORY: Family History  Problem Relation Age of Onset  . Cancer Mother   . Cancer Father   . Heart failure Maternal Grandmother   . Cancer Maternal Grandfather   . Sleep apnea Father   . Cancer Paternal Grandmother   . Cancer Paternal Grandfather   . COPD Brother     SOCIAL HISTORY: Social History   Socioeconomic History  . Marital status: Married    Spouse name: Threasa Beards  . Number of children: 4  . Years of education: 38  . Highest education level: Not on file  Occupational History    Comment: Attorney  Tobacco Use  . Smoking status: Former Smoker    Quit date: 09/16/1978    Years since quitting: 40.7  . Smokeless tobacco: Never Used  Substance and Sexual Activity  . Alcohol use: Yes    Alcohol/week: 3.0 standard drinks    Types: 3 Standard drinks or equivalent per week    Comment: occas.  . Drug use:  No  . Sexual activity: Not on file  Other Topics Concern  . Not on file  Social History Narrative   Patient consumes 1-2 cups caffeine daily   Social Determinants of Health   Financial Resource Strain:   . Difficulty of Paying Living Expenses:   Food Insecurity:   . Worried About Charity fundraiser in the Last Year:   . Arboriculturist in the Last Year:   Transportation Needs:   . Film/video editor (Medical):   Marland Kitchen Lack of Transportation (Non-Medical):   Physical Activity:   . Days of Exercise per Week:   . Minutes of Exercise per Session:   Stress:   . Feeling of Stress :   Social Connections:   . Frequency of Communication with Friends and Family:   . Frequency of Social Gatherings with Friends and Family:   . Attends Religious Services:   . Active Member of Clubs or Organizations:   . Attends Archivist Meetings:   Marland Kitchen Marital Status:   Intimate Partner Violence:   . Fear of Current or Ex-Partner:   . Emotionally Abused:   Marland Kitchen Physically Abused:   . Sexually Abused:       PHYSICAL EXAM  Vitals:   05/30/19 0806  BP: 127/82  Pulse: 65  Temp: (!) 97 F (36.1 C)  Weight: 265 lb (120.2 kg)  Height: '6\' 3"'$  (1.905 m)   Body mass index is 33.12 kg/m.  Generalized: Well developed, in no acute distress  Chest: Lungs clear to auscultation bilaterally  Neurological examination  Mentation: Alert oriented to time, place, history taking. Follows all commands speech and language fluent Cranial nerve II-XII: Extraocular movements were full, visual field were full on confrontational test Head turning and shoulder shrug  were normal and symmetric. Motor: The motor testing reveals 5 over 5 strength of all 4 extremities. Good symmetric motor tone is noted throughout.  Sensory: Sensory testing is intact to soft touch on all 4 extremities. No evidence of extinction is noted.  Gait and station: Gait is normal.    DIAGNOSTIC DATA (LABS, IMAGING, TESTING) - I  reviewed patient records, labs, notes, testing and imaging myself where available.  Lab Results  Component Value Date   WBC 5.0 05/24/2008   HGB 12.5 (L) 05/24/2008   HCT 35.2 (L) 05/24/2008   MCV 99.6 05/24/2008   PLT 151 05/24/2008      Component Value Date/Time   NA 136 05/24/2008 0448   K 3.4 (L) 05/24/2008 0448  CL 105 05/24/2008 0448   CO2 26 05/24/2008 0448   GLUCOSE 108 (H) 05/24/2008 0448   BUN 6 05/24/2008 0448   CREATININE 0.82 05/24/2008 0448   CALCIUM 8.0 (L) 05/24/2008 0448   PROT 6.1 05/22/2008 1555   ALBUMIN 3.4 (L) 05/22/2008 1555   AST 26 05/22/2008 1555   ALT 18 05/22/2008 1555   ALKPHOS 75 05/22/2008 1555   BILITOT 1.0 05/22/2008 1555   GFRNONAA >60 05/24/2008 0448   GFRAA  05/24/2008 0448    >60        The eGFR has been calculated using the MDRD equation. This calculation has not been validated in all clinical situations. eGFR's persistently <60 mL/min signify possible Chronic Kidney Disease.      ASSESSMENT AND PLAN 64 y.o. year old male  has a past medical history of Crohn's disease ([place]), OSA on CPAP (07/31/2013), and Pernicious anemia. here with:  1. OSA on CPAP  - CPAP compliance suboptimal: Use machine greater than 4 hours only 67% - Good treatment of AHI  - Encourage patient to use CPAP nightly and > 4 hours each night - F/U in 1 year or sooner if needed   I spent 25 minutes of face-to-face and non-face-to-face time with patient.  This included previsit chart review, lab review, study review, order entry, electronic health record documentation, patient education.  Ward Givens, MSN, NP-C 05/30/2019, 8:04 AM [facility] [address], [place], [place] [address] [phone]  I reviewed the above note and documentation by the Nurse Practitioner and agree with the history, exam, assessment and plan as outlined above. I was available for consultation. Star Age, MD, PhD [facility]  [facility])

## 2019-05-30 NOTE — Patient Instructions (Signed)
Continue using CPAP nightly and greater than 4 hours each night °If your symptoms worsen or you develop new symptoms please let us know.  ° °

## 2019-06-05 ENCOUNTER — Ambulatory Visit: Payer: BLUE CROSS/BLUE SHIELD | Admitting: Adult Health

## 2019-06-24 DIAGNOSIS — Z1159 Encounter for screening for other viral diseases: Secondary | ICD-10-CM | POA: Diagnosis not present

## 2019-06-27 DIAGNOSIS — Z980 Intestinal bypass and anastomosis status: Secondary | ICD-10-CM | POA: Diagnosis not present

## 2019-06-27 DIAGNOSIS — K6389 Other specified diseases of intestine: Secondary | ICD-10-CM | POA: Diagnosis not present

## 2019-06-27 DIAGNOSIS — D125 Benign neoplasm of sigmoid colon: Secondary | ICD-10-CM | POA: Diagnosis not present

## 2019-06-27 DIAGNOSIS — K5289 Other specified noninfective gastroenteritis and colitis: Secondary | ICD-10-CM | POA: Diagnosis not present

## 2019-06-27 DIAGNOSIS — K5080 Crohn's disease of both small and large intestine without complications: Secondary | ICD-10-CM | POA: Diagnosis not present

## 2019-08-06 DIAGNOSIS — G4733 Obstructive sleep apnea (adult) (pediatric): Secondary | ICD-10-CM | POA: Diagnosis not present

## 2019-08-15 DIAGNOSIS — R600 Localized edema: Secondary | ICD-10-CM | POA: Diagnosis not present

## 2019-08-15 DIAGNOSIS — R7309 Other abnormal glucose: Secondary | ICD-10-CM | POA: Diagnosis not present

## 2019-08-15 DIAGNOSIS — R202 Paresthesia of skin: Secondary | ICD-10-CM | POA: Diagnosis not present

## 2019-08-15 DIAGNOSIS — M545 Low back pain: Secondary | ICD-10-CM | POA: Diagnosis not present

## 2019-08-28 DIAGNOSIS — H43811 Vitreous degeneration, right eye: Secondary | ICD-10-CM | POA: Diagnosis not present

## 2019-09-25 DIAGNOSIS — K5080 Crohn's disease of both small and large intestine without complications: Secondary | ICD-10-CM | POA: Diagnosis not present

## 2019-10-11 DIAGNOSIS — Z125 Encounter for screening for malignant neoplasm of prostate: Secondary | ICD-10-CM | POA: Diagnosis not present

## 2019-10-11 DIAGNOSIS — E291 Testicular hypofunction: Secondary | ICD-10-CM | POA: Diagnosis not present

## 2019-10-11 DIAGNOSIS — Z0000 Encounter for general adult medical examination without abnormal findings: Secondary | ICD-10-CM | POA: Diagnosis not present

## 2019-10-11 DIAGNOSIS — E559 Vitamin D deficiency, unspecified: Secondary | ICD-10-CM | POA: Diagnosis not present

## 2019-10-11 DIAGNOSIS — E538 Deficiency of other specified B group vitamins: Secondary | ICD-10-CM | POA: Diagnosis not present

## 2019-10-11 DIAGNOSIS — Z79899 Other long term (current) drug therapy: Secondary | ICD-10-CM | POA: Diagnosis not present

## 2019-10-11 DIAGNOSIS — R7309 Other abnormal glucose: Secondary | ICD-10-CM | POA: Diagnosis not present

## 2019-10-18 DIAGNOSIS — Z0000 Encounter for general adult medical examination without abnormal findings: Secondary | ICD-10-CM | POA: Diagnosis not present

## 2019-10-18 DIAGNOSIS — H6122 Impacted cerumen, left ear: Secondary | ICD-10-CM | POA: Diagnosis not present

## 2019-10-18 DIAGNOSIS — R82998 Other abnormal findings in urine: Secondary | ICD-10-CM | POA: Diagnosis not present

## 2019-10-18 DIAGNOSIS — G4733 Obstructive sleep apnea (adult) (pediatric): Secondary | ICD-10-CM | POA: Diagnosis not present

## 2019-11-06 ENCOUNTER — Other Ambulatory Visit: Payer: BC Managed Care – PPO

## 2019-11-06 DIAGNOSIS — Z20822 Contact with and (suspected) exposure to covid-19: Secondary | ICD-10-CM | POA: Diagnosis not present

## 2019-11-06 DIAGNOSIS — G4733 Obstructive sleep apnea (adult) (pediatric): Secondary | ICD-10-CM | POA: Diagnosis not present

## 2019-11-07 LAB — NOVEL CORONAVIRUS, NAA: SARS-CoV-2, NAA: NOT DETECTED

## 2019-11-07 LAB — SARS-COV-2, NAA 2 DAY TAT

## 2019-11-08 DIAGNOSIS — Z23 Encounter for immunization: Secondary | ICD-10-CM | POA: Diagnosis not present

## 2020-01-03 ENCOUNTER — Other Ambulatory Visit: Payer: BC Managed Care – PPO

## 2020-01-03 DIAGNOSIS — Z20822 Contact with and (suspected) exposure to covid-19: Secondary | ICD-10-CM | POA: Diagnosis not present

## 2020-01-03 DIAGNOSIS — K5080 Crohn's disease of both small and large intestine without complications: Secondary | ICD-10-CM | POA: Diagnosis not present

## 2020-01-04 LAB — SARS-COV-2, NAA 2 DAY TAT

## 2020-01-04 LAB — NOVEL CORONAVIRUS, NAA: SARS-CoV-2, NAA: NOT DETECTED

## 2020-01-07 DIAGNOSIS — N401 Enlarged prostate with lower urinary tract symptoms: Secondary | ICD-10-CM | POA: Diagnosis not present

## 2020-01-07 DIAGNOSIS — R350 Frequency of micturition: Secondary | ICD-10-CM | POA: Diagnosis not present

## 2020-01-07 DIAGNOSIS — E291 Testicular hypofunction: Secondary | ICD-10-CM | POA: Diagnosis not present

## 2020-01-14 DIAGNOSIS — R350 Frequency of micturition: Secondary | ICD-10-CM | POA: Diagnosis not present

## 2020-01-14 DIAGNOSIS — N401 Enlarged prostate with lower urinary tract symptoms: Secondary | ICD-10-CM | POA: Diagnosis not present

## 2020-01-14 DIAGNOSIS — R3915 Urgency of urination: Secondary | ICD-10-CM | POA: Diagnosis not present

## 2020-02-03 DIAGNOSIS — Z20822 Contact with and (suspected) exposure to covid-19: Secondary | ICD-10-CM | POA: Diagnosis not present

## 2020-02-06 DIAGNOSIS — G4733 Obstructive sleep apnea (adult) (pediatric): Secondary | ICD-10-CM | POA: Diagnosis not present

## 2020-02-24 DIAGNOSIS — R3915 Urgency of urination: Secondary | ICD-10-CM | POA: Diagnosis not present

## 2020-02-24 DIAGNOSIS — R350 Frequency of micturition: Secondary | ICD-10-CM | POA: Diagnosis not present

## 2020-04-09 DIAGNOSIS — K5080 Crohn's disease of both small and large intestine without complications: Secondary | ICD-10-CM | POA: Diagnosis not present

## 2020-05-07 DIAGNOSIS — G4733 Obstructive sleep apnea (adult) (pediatric): Secondary | ICD-10-CM | POA: Diagnosis not present

## 2020-05-29 ENCOUNTER — Ambulatory Visit (HOSPITAL_COMMUNITY)
Admission: RE | Admit: 2020-05-29 | Discharge: 2020-05-29 | Disposition: A | Discharge status: Home / Self Care | Payer: BC Managed Care – PPO | Source: Ambulatory Visit | Attending: Internal Medicine | Admitting: Internal Medicine

## 2020-05-29 ENCOUNTER — Other Ambulatory Visit: Payer: Self-pay

## 2020-05-29 ENCOUNTER — Other Ambulatory Visit (HOSPITAL_COMMUNITY): Payer: Self-pay | Admitting: Internal Medicine

## 2020-05-29 ENCOUNTER — Other Ambulatory Visit: Payer: Self-pay | Admitting: Internal Medicine

## 2020-05-29 DIAGNOSIS — R194 Change in bowel habit: Secondary | ICD-10-CM | POA: Diagnosis not present

## 2020-05-29 DIAGNOSIS — R140 Abdominal distension (gaseous): Secondary | ICD-10-CM | POA: Diagnosis not present

## 2020-05-29 DIAGNOSIS — K8020 Calculus of gallbladder without cholecystitis without obstruction: Secondary | ICD-10-CM | POA: Diagnosis not present

## 2020-05-29 DIAGNOSIS — R0602 Shortness of breath: Secondary | ICD-10-CM | POA: Diagnosis not present

## 2020-05-29 LAB — POCT I-STAT CREATININE: Creatinine, Ser: 0.8 mg/dL (ref 0.61–1.24)

## 2020-05-29 MED ORDER — IOHEXOL 300 MG/ML  SOLN
100.0000 mL | Freq: Once | INTRAMUSCULAR | Status: AC | PRN
  Administered 2020-05-29: 100 mL via INTRAVENOUS

## 2020-05-29 MED ORDER — IOHEXOL 9 MG/ML PO SOLN
1000.0000 mL | ORAL | Status: AC
  Administered 2020-05-29: 1000 mL via ORAL

## 2020-05-29 MED ORDER — IOHEXOL 9 MG/ML PO SOLN
ORAL | Status: AC
  Filled 2020-05-29: qty 1000

## 2020-06-01 DIAGNOSIS — Z85828 Personal history of other malignant neoplasm of skin: Secondary | ICD-10-CM | POA: Diagnosis not present

## 2020-06-01 DIAGNOSIS — L918 Other hypertrophic disorders of the skin: Secondary | ICD-10-CM | POA: Diagnosis not present

## 2020-06-01 DIAGNOSIS — L821 Other seborrheic keratosis: Secondary | ICD-10-CM | POA: Diagnosis not present

## 2020-06-01 DIAGNOSIS — D1801 Hemangioma of skin and subcutaneous tissue: Secondary | ICD-10-CM | POA: Diagnosis not present

## 2020-06-01 DIAGNOSIS — D044 Carcinoma in situ of skin of scalp and neck: Secondary | ICD-10-CM | POA: Diagnosis not present

## 2020-06-01 DIAGNOSIS — C44629 Squamous cell carcinoma of skin of left upper limb, including shoulder: Secondary | ICD-10-CM | POA: Diagnosis not present

## 2020-06-01 DIAGNOSIS — L570 Actinic keratosis: Secondary | ICD-10-CM | POA: Diagnosis not present

## 2020-06-03 ENCOUNTER — Ambulatory Visit: Payer: BC Managed Care – PPO | Admitting: Adult Health

## 2020-06-09 ENCOUNTER — Ambulatory Visit (INDEPENDENT_AMBULATORY_CARE_PROVIDER_SITE_OTHER): Payer: BC Managed Care – PPO | Admitting: Adult Health

## 2020-06-09 ENCOUNTER — Encounter: Payer: Self-pay | Admitting: Adult Health

## 2020-06-09 VITALS — BP 149/92 | HR 73 | Ht 75.0 in | Wt 267.0 lb

## 2020-06-09 DIAGNOSIS — G4733 Obstructive sleep apnea (adult) (pediatric): Secondary | ICD-10-CM

## 2020-06-09 DIAGNOSIS — Z9989 Dependence on other enabling machines and devices: Secondary | ICD-10-CM | POA: Diagnosis not present

## 2020-06-09 NOTE — Patient Instructions (Signed)
Continue using CPAP nightly and greater than 4 hours each night °If your symptoms worsen or you develop new symptoms please let us know.  ° °

## 2020-06-09 NOTE — Progress Notes (Addendum)
PATIENT: Scott [name] DOB: 10-21-1955  REASON FOR VISIT: follow up HISTORY FROM: patient  HISTORY OF PRESENT ILLNESS: Today 06/09/20:  Scott [name] is a 65 year old male with a history of obstructive sleep apnea on CPAP.  He reports that the CPAP continues to work well for him.  He states that there are some nights he does not use it greater than 4 hours because he does not sleep more than 4 hours.  He lost his wife last June.  States that he is very lonely without her-understandably so they were married 40 years.  He joins me today for follow-up.   05/30/19: Scott [name] is a 65 year old male with a history of obstructive sleep apnea on CPAP.  His download indicates that he use his machine nightly for compliance of 100%.  He uses machine greater than 4 hours 20 out of 30 days for compliance of 67%.  Her residual AHI is 1 on 8 cm of water with EPR 3.  Leak in the 95th percentile is 12.8 L/min.  He reports that the CPAP is working well for him.  Reports that he tends to stay up too late and therefore he does not use a CPAP for very long some nights.   HISTORY (copied from Dr. Guadelupe Sabin note) 02/28/17: I reviewed his CPAP compliance data from 01/28/2017 through 02/26/2017 which is a total of 30 days, during which time he used his home CPAP 18 days but also his travel CPAP. On the home CPAP his average usage was 5 hours and 47 minutes, residual AHI at goal at 1.1 per hour, leak acceptable with the 95th percentile at 9.3 L/m on a pressure of 8 cm with EPR of 3. A download is not possible from his travel CPAP. He is not sure if it is that at the same pressure level as his home CPAP. Of note, I provided him with a travel CPAP prescription last year in December, it was supposed to be set at the same pressure level as his regular CPAP. He is advised to double check with his DME company. He reports doing well, compliant with CPAP, no recent illness, no major changes to medical Hx of medications,  no longer on testosterone. Crohn's stable. Tries to hydrate well with water.   REVIEW OF SYSTEMS: Out of a complete 14 system review of symptoms, the patient complains only of the following symptoms, and all other reviewed systems are negative.  See FYI  ALLERGIES: No Known Allergies  HOME MEDICATIONS: Outpatient Medications Prior to Visit  Medication Sig Dispense Refill  . azaTHIOprine (IMURAN) 50 MG tablet Take 200 mg by mouth daily.    Marland Kitchen CALCIUM PO Take 600 mg by mouth 2 (two) times daily.     . Cholecalciferol (VITAMIN D3) 2000 UNITS TABS Take 2 tablets by mouth daily. Twice daily    . colesevelam (WELCHOL) 625 MG tablet Take 1,875 mg by mouth 2 (two) times daily with a meal.     . Cyanocobalamin (VITAMIN B 12) 100 MCG LOZG Place 1 tablet under the tongue daily.    . hydrocortisone 2.5 % cream Apply 1 application topically daily.    . Loperamide HCl (IMODIUM PO) Take 4 tablets by mouth 4 (four) times daily.    . Multiple Vitamin (MULTIVITAMIN) capsule Take 1 capsule by mouth daily.    Marland Kitchen MYRBETRIQ 50 MG TB24 tablet Take 50 mg by mouth daily.    Marland Kitchen omeprazole (PRILOSEC) 20 MG capsule Take 20 mg by mouth  daily.    Marland Kitchen POTASSIUM PO Take 1 tablet by mouth daily.     . Probiotic Product (ALIGN) 4 MG CAPS Take 1 capsule by mouth daily.     No facility-administered medications prior to visit.    PAST MEDICAL HISTORY: Past Medical History:  Diagnosis Date  . Crohn's disease ([place])   . OSA on CPAP 07/31/2013  . Pernicious anemia     PAST SURGICAL HISTORY: Past Surgical History:  Procedure Laterality Date  . BOWEL RESECTION     2    FAMILY HISTORY: Family History  Problem Relation Age of Onset  . Cancer Mother   . Cancer Father   . Heart failure Maternal Grandmother   . Cancer Maternal Grandfather   . Sleep apnea Father   . Cancer Paternal Grandmother   . Cancer Paternal Grandfather   . COPD Brother     SOCIAL HISTORY: Social History   Socioeconomic History  . Marital  status: Married    Spouse name: Threasa Beards  . Number of children: 4  . Years of education: 62  . Highest education level: Not on file  Occupational History    Comment: Attorney  Tobacco Use  . Smoking status: Former Smoker    Quit date: 09/16/1978    Years since quitting: 41.7  . Smokeless tobacco: Never Used  Substance and Sexual Activity  . Alcohol use: Yes    Alcohol/week: 3.0 standard drinks    Types: 3 Standard drinks or equivalent per week    Comment: occas.  . Drug use: No  . Sexual activity: Not on file  Other Topics Concern  . Not on file  Social History Narrative   Patient consumes 1-2 cups caffeine daily   Social Determinants of Health   Financial Resource Strain: Not on file  Food Insecurity: Not on file  Transportation Needs: Not on file  Physical Activity: Not on file  Stress: Not on file  Social Connections: Not on file  Intimate Partner Violence: Not on file      PHYSICAL EXAM  Vitals:   06/09/20 0802  BP: (!) 149/92  Pulse: 73  Weight: 267 lb (121.1 kg)  Height: $Remove'6\' 3"'uRdVVAL$  (1.905 m)   Body mass index is 33.37 kg/m.  Generalized: Well developed, in no acute distress  Chest: Lungs clear to auscultation bilaterally  Neurological examination  Mentation: Alert oriented to time, place, history taking. Follows all commands speech and language fluent Cranial nerve II-XII: Extraocular movements were full, visual field were full on confrontational test Head turning and shoulder shrug  were normal and symmetric. Motor: The motor testing reveals 5 over 5 strength of all 4 extremities. Good symmetric motor tone is noted throughout.  Sensory: Sensory testing is intact to soft touch on all 4 extremities. No evidence of extinction is noted.  Gait and station: Gait is normal.    DIAGNOSTIC DATA (LABS, IMAGING, TESTING) - I reviewed patient records, labs, notes, testing and imaging myself where available.  Lab Results  Component Value Date   WBC 5.0 05/24/2008    HGB 12.5 (L) 05/24/2008   HCT 35.2 (L) 05/24/2008   MCV 99.6 05/24/2008   PLT 151 05/24/2008      Component Value Date/Time   NA 136 05/24/2008 0448   K 3.4 (L) 05/24/2008 0448   CL 105 05/24/2008 0448   CO2 26 05/24/2008 0448   GLUCOSE 108 (H) 05/24/2008 0448   BUN 6 05/24/2008 0448   CREATININE 0.80 05/29/2020 1643   CALCIUM  8.0 (L) 05/24/2008 0448   PROT 6.1 05/22/2008 1555   ALBUMIN 3.4 (L) 05/22/2008 1555   AST 26 05/22/2008 1555   ALT 18 05/22/2008 1555   ALKPHOS 75 05/22/2008 1555   BILITOT 1.0 05/22/2008 1555   GFRNONAA >60 05/24/2008 0448   GFRAA  05/24/2008 0448    >60        The eGFR has been calculated using the MDRD equation. This calculation has not been validated in all clinical situations. eGFR's persistently <60 mL/min signify possible Chronic Kidney Disease.      ASSESSMENT AND PLAN 65 y.o. year old male  has a past medical history of Crohn's disease ([place]), OSA on CPAP (07/31/2013), and Pernicious anemia. here with:  1. OSA on CPAP  - CPAP compliance suboptimal - Good treatment of AHI  - Encourage patient to use CPAP nightly and > 4 hours each night - F/U in 1 year or sooner if needed   Ward Givens, MSN, NP-C 06/09/2020, 8:11 AM [facility] [address] [place], [place] [address] [phone]   I reviewed the above note and documentation by the Nurse Practitioner and agree with the history, exam, assessment and plan as outlined above. I was available for consultation. Star Age, MD, PhD [facility])

## 2020-06-19 DIAGNOSIS — R140 Abdominal distension (gaseous): Secondary | ICD-10-CM | POA: Diagnosis not present

## 2020-06-19 DIAGNOSIS — K5080 Crohn's disease of both small and large intestine without complications: Secondary | ICD-10-CM | POA: Diagnosis not present

## 2020-06-19 DIAGNOSIS — R143 Flatulence: Secondary | ICD-10-CM | POA: Diagnosis not present

## 2020-07-02 DIAGNOSIS — F4320 Adjustment disorder, unspecified: Secondary | ICD-10-CM | POA: Diagnosis not present

## 2020-07-09 DIAGNOSIS — F4320 Adjustment disorder, unspecified: Secondary | ICD-10-CM | POA: Diagnosis not present

## 2020-08-06 DIAGNOSIS — G4733 Obstructive sleep apnea (adult) (pediatric): Secondary | ICD-10-CM | POA: Diagnosis not present

## 2020-08-16 DIAGNOSIS — G4733 Obstructive sleep apnea (adult) (pediatric): Secondary | ICD-10-CM | POA: Diagnosis not present

## 2020-08-20 DIAGNOSIS — K5080 Crohn's disease of both small and large intestine without complications: Secondary | ICD-10-CM | POA: Diagnosis not present

## 2020-08-20 DIAGNOSIS — R140 Abdominal distension (gaseous): Secondary | ICD-10-CM | POA: Diagnosis not present

## 2020-08-20 DIAGNOSIS — R143 Flatulence: Secondary | ICD-10-CM | POA: Diagnosis not present

## 2020-08-27 DIAGNOSIS — F4320 Adjustment disorder, unspecified: Secondary | ICD-10-CM | POA: Diagnosis not present

## 2020-09-10 DIAGNOSIS — F4320 Adjustment disorder, unspecified: Secondary | ICD-10-CM | POA: Diagnosis not present

## 2020-10-01 DIAGNOSIS — F4320 Adjustment disorder, unspecified: Secondary | ICD-10-CM | POA: Diagnosis not present

## 2020-10-20 DIAGNOSIS — Z125 Encounter for screening for malignant neoplasm of prostate: Secondary | ICD-10-CM | POA: Diagnosis not present

## 2020-10-20 DIAGNOSIS — E559 Vitamin D deficiency, unspecified: Secondary | ICD-10-CM | POA: Diagnosis not present

## 2020-10-20 DIAGNOSIS — K5090 Crohn's disease, unspecified, without complications: Secondary | ICD-10-CM | POA: Diagnosis not present

## 2020-10-20 DIAGNOSIS — E538 Deficiency of other specified B group vitamins: Secondary | ICD-10-CM | POA: Diagnosis not present

## 2020-10-20 DIAGNOSIS — Z79899 Other long term (current) drug therapy: Secondary | ICD-10-CM | POA: Diagnosis not present

## 2020-10-20 DIAGNOSIS — E291 Testicular hypofunction: Secondary | ICD-10-CM | POA: Diagnosis not present

## 2020-10-22 DIAGNOSIS — F4320 Adjustment disorder, unspecified: Secondary | ICD-10-CM | POA: Diagnosis not present

## 2020-10-27 DIAGNOSIS — Z0000 Encounter for general adult medical examination without abnormal findings: Secondary | ICD-10-CM | POA: Diagnosis not present

## 2020-10-27 DIAGNOSIS — Z1339 Encounter for screening examination for other mental health and behavioral disorders: Secondary | ICD-10-CM | POA: Diagnosis not present

## 2020-10-27 DIAGNOSIS — K5090 Crohn's disease, unspecified, without complications: Secondary | ICD-10-CM | POA: Diagnosis not present

## 2020-10-27 DIAGNOSIS — Z1331 Encounter for screening for depression: Secondary | ICD-10-CM | POA: Diagnosis not present

## 2020-10-30 DIAGNOSIS — Z23 Encounter for immunization: Secondary | ICD-10-CM | POA: Diagnosis not present

## 2020-11-03 DIAGNOSIS — R140 Abdominal distension (gaseous): Secondary | ICD-10-CM | POA: Diagnosis not present

## 2020-11-06 DIAGNOSIS — G4733 Obstructive sleep apnea (adult) (pediatric): Secondary | ICD-10-CM | POA: Diagnosis not present

## 2020-12-02 DIAGNOSIS — C44629 Squamous cell carcinoma of skin of left upper limb, including shoulder: Secondary | ICD-10-CM | POA: Diagnosis not present

## 2020-12-02 DIAGNOSIS — L570 Actinic keratosis: Secondary | ICD-10-CM | POA: Diagnosis not present

## 2020-12-02 DIAGNOSIS — L820 Inflamed seborrheic keratosis: Secondary | ICD-10-CM | POA: Diagnosis not present

## 2020-12-07 DIAGNOSIS — R143 Flatulence: Secondary | ICD-10-CM | POA: Diagnosis not present

## 2020-12-07 DIAGNOSIS — K5080 Crohn's disease of both small and large intestine without complications: Secondary | ICD-10-CM | POA: Diagnosis not present

## 2020-12-07 DIAGNOSIS — R140 Abdominal distension (gaseous): Secondary | ICD-10-CM | POA: Diagnosis not present

## 2021-02-16 DIAGNOSIS — G4733 Obstructive sleep apnea (adult) (pediatric): Secondary | ICD-10-CM | POA: Diagnosis not present

## 2021-02-25 DIAGNOSIS — R350 Frequency of micturition: Secondary | ICD-10-CM | POA: Diagnosis not present

## 2021-02-25 DIAGNOSIS — N401 Enlarged prostate with lower urinary tract symptoms: Secondary | ICD-10-CM | POA: Diagnosis not present

## 2021-03-25 DIAGNOSIS — N401 Enlarged prostate with lower urinary tract symptoms: Secondary | ICD-10-CM | POA: Diagnosis not present

## 2021-03-25 DIAGNOSIS — R350 Frequency of micturition: Secondary | ICD-10-CM | POA: Diagnosis not present

## 2021-04-30 DIAGNOSIS — E291 Testicular hypofunction: Secondary | ICD-10-CM | POA: Diagnosis not present

## 2021-05-18 DIAGNOSIS — G4733 Obstructive sleep apnea (adult) (pediatric): Secondary | ICD-10-CM | POA: Diagnosis not present

## 2021-05-20 ENCOUNTER — Ambulatory Visit (INDEPENDENT_AMBULATORY_CARE_PROVIDER_SITE_OTHER): Payer: BC Managed Care – PPO | Admitting: Pulmonary Disease

## 2021-05-20 ENCOUNTER — Encounter: Payer: Self-pay | Admitting: Pulmonary Disease

## 2021-05-20 VITALS — BP 124/70 | HR 87 | Ht 75.0 in | Wt 274.4 lb

## 2021-05-20 DIAGNOSIS — R0602 Shortness of breath: Secondary | ICD-10-CM | POA: Diagnosis not present

## 2021-05-20 NOTE — Progress Notes (Signed)
? ?Synopsis: Referred in April 2023 for dyspnea by Velna Hatchet, MD ? ?Subjective:  ? ?PATIENT ID: Scott [name] GENDER: male DOB: 1955/07/13, MRN: [id number] ? ?HPI ? ?Chief Complaint  ?Patient presents with  ? Consult  ?  Referred by PCP for increased DOE after having COVID twice in 2022. Has noticed that his SOB has remained.   ? ?Scott [name] is a 66 year old male, former smoker with crohn's disease, OSA on CPAP and pernicious anemia who is referred to pulmonary clinic for shortness of breath.  ? ?Patient had covid 19 infection in April and September 2022 where he was treated with paxlovid both times. He has noticed on going dyspnea with exertion since these infections. He had some dyspnea prior to the infections but since the infections, the dyspnea is much worse. He reports a trip to the mountains where he noted increased dyspnea on hiking and required frequent stops to catch his breath. His heart rate would get up to 150-160 range. He notices dyspnea going up the stairs at home. He does have some wheezing with the dyspnea on exertion. Occasional cough. He has orthopnea and progressive lower extremity edema over the past 2 years. ? ?He was trialed on symbicort for 2-3 weeks without noticeable improvement in his breathing.  ? ?He has history of pulmonary emboli in the 1970s when he had a crohn's flare. He is currently on imuran for crohn's disease. Denies any fevers, chills or sweats. He is using CPAP regularly for OSA and is followed at [facility]. ? ?He is a former smoker with 30 pack year history. He quit smoking in August 1980. 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 No history of harmful dust or chemical exposures.  ? ? ?Past Medical History:  ?Diagnosis Date  ? Crohn's disease ([place])   ? OSA on CPAP 07/31/2013  ? Pernicious anemia   ?  ? ?Family History  ?Problem Relation Age of Onset  ? Cancer Mother   ? Cancer Father   ? Heart failure Maternal Grandmother   ?  Cancer Maternal Grandfather   ? Sleep apnea Father   ? Cancer Paternal Grandmother   ? Cancer Paternal Grandfather   ? COPD Brother   ?  ? ?Social History  ? ?Socioeconomic History  ? Marital status: Widowed  ?  Spouse name: Threasa Beards  ? Number of children: 4  ? Years of education: 54  ? Highest education level: Not on file  ?Occupational History  ?  Comment: Attorney  ?Tobacco Use  ? Smoking status: Former  ?  Types: Cigarettes  ?  Quit date: 09/16/1978  ?  Years since quitting: 42.7  ? Smokeless tobacco: Never  ?Substance and Sexual Activity  ? Alcohol use: Yes  ?  Alcohol/week: 3.0 standard drinks  ?  Types: 3 Standard drinks or equivalent per week  ?  Comment: occas.  ? Drug use: No  ? Sexual activity: Not on file  ?Other Topics Concern  ? Not on file  ?Social History Narrative  ? Patient consumes 1-2 cups caffeine daily  ? ?Social Determinants of Health  ? ?Financial Resource Strain: Not on file  ?Food Insecurity: Not on file  ?Transportation Needs: Not on file  ?Physical Activity: Not on file  ?Stress: Not on file  ?Social Connections: Not on file  ?Intimate Partner Violence: Not on file  ?  ? ?No Known Allergies  ? ?Outpatient Medications Prior to Visit  ?Medication Sig Dispense  Refill  ? azaTHIOprine (IMURAN) 50 MG tablet Take 200 mg by mouth daily.    ? CALCIUM PO Take 600 mg by mouth 2 (two) times daily.     ? Cholecalciferol (VITAMIN D3) 2000 UNITS TABS Take 2 tablets by mouth daily. Twice daily    ? colesevelam (WELCHOL) 625 MG tablet Take 1,875 mg by mouth 2 (two) times daily with a meal.     ? Cyanocobalamin (VITAMIN B 12) 100 MCG LOZG Place 1 tablet under the tongue daily.    ? Loperamide HCl (IMODIUM PO) Take 4 tablets by mouth 4 (four) times daily.    ? Multiple Vitamin (MULTIVITAMIN) capsule Take 1 capsule by mouth daily.    ? omeprazole (PRILOSEC) 20 MG capsule Take 20 mg by mouth daily.    ? POTASSIUM PO Take 1 tablet by mouth daily.     ? Probiotic Product (ALIGN) 4 MG CAPS Take 1 capsule by mouth  daily.    ? hydrocortisone 2.5 % cream Apply 1 application topically daily.    ? MYRBETRIQ 50 MG TB24 tablet Take 50 mg by mouth daily.    ? ?No facility-administered medications prior to visit.  ? ?Review of Systems  ?Constitutional:  Negative for chills, fever, malaise/fatigue and weight loss.  ?HENT:  Negative for congestion, sinus pain and sore throat.   ?Eyes: Negative.   ?Respiratory:  Positive for shortness of breath. Negative for cough, hemoptysis, sputum production and wheezing.   ?Cardiovascular:  Positive for orthopnea and leg swelling. Negative for chest pain, palpitations and claudication.  ?Gastrointestinal:  Negative for abdominal pain, heartburn, nausea and vomiting.  ?Genitourinary: Negative.   ?Musculoskeletal:  Negative for joint pain and myalgias.  ?Skin:  Negative for rash.  ?Neurological:  Negative for weakness.  ?Endo/Heme/Allergies: Negative.   ?Psychiatric/Behavioral: Negative.    ? ?Objective:  ? ?Vitals:  ? 05/20/21 1437  ?BP: 124/70  ?Pulse: 87  ?SpO2: 95%  ?Weight: 274 lb 6.4 oz (124.5 kg)  ?Height: 6\' 3"  (1.905 m)  ? ? ?Physical Exam ?Constitutional:   ?   General: He is not in acute distress. ?   Appearance: He is obese.  ?HENT:  ?   Head: Normocephalic and atraumatic.  ?Eyes:  ?   Extraocular Movements: Extraocular movements intact.  ?   Conjunctiva/sclera: Conjunctivae normal.  ?   Pupils: Pupils are equal, round, and reactive to light.  ?Cardiovascular:  ?   Rate and Rhythm: Normal rate and regular rhythm.  ?   Pulses: Normal pulses.  ?   Heart sounds: Normal heart sounds. No murmur heard. ?Pulmonary:  ?   Breath sounds: Decreased air movement present. Examination of the left-lower field reveals decreased breath sounds. Decreased breath sounds present.  ?Abdominal:  ?   General: Bowel sounds are normal.  ?   Palpations: Abdomen is soft.  ?Musculoskeletal:  ?   Right lower leg: Edema present.  ?   Left lower leg: Edema present.  ?Lymphadenopathy:  ?   Cervical: No cervical  adenopathy.  ?Skin: ?   General: Skin is warm and dry.  ?Neurological:  ?   General: No focal deficit present.  ?   Mental Status: He is alert.  ?Psychiatric:     ?   Mood and Affect: Mood normal.     ?   Behavior: Behavior normal.     ?   Thought Content: Thought content normal.     ?   Judgment: Judgment normal.  ? ?CBC ?   ?  Component Value Date/Time  ? WBC 5.0 05/24/2008 0448  ? RBC 3.53 (L) 05/24/2008 0448  ? HGB 12.5 (L) 05/24/2008 0448  ? HCT 35.2 (L) 05/24/2008 0448  ? PLT 151 05/24/2008 0448  ? MCV 99.6 05/24/2008 0448  ? MCHC 35.5 05/24/2008 0448  ? RDW 14.5 05/24/2008 0448  ? LYMPHSABS 0.5 (L) 05/22/2008 1555  ? MONOABS 0.6 05/22/2008 1555  ? EOSABS 0.0 05/22/2008 1555  ? BASOSABS 0.0 05/22/2008 1555  ? ? ?  Latest Ref Rng & Units 05/29/2020  ?  4:43 PM 05/24/2008  ?  4:48 AM 05/23/2008  ?  4:03 AM  ?BMP  ?Glucose 70 - 99 mg/dL  108   108    ?BUN 6 - 23 mg/dL  6   9    ?Creatinine 0.61 - 1.24 mg/dL 0.80   0.82   0.93    ?Sodium 135 - 145 mEq/L  136   136    ?Potassium 3.5 - 5.1 mEq/L  3.4   3.7    ?Chloride 96 - 112 mEq/L  105   104    ?CO2 19 - 32 mEq/L  26   27    ?Calcium 8.4 - 10.5 mg/dL  8.0   8.0    ? ?Chest imaging: ?CT Abdomen 05/29/20 ?Lower chest: Bibasilar linear consolidation consistent with scarring ?or atelectasis. No acute pleural or parenchymal lung disease. ? ?PFT: ?   ? View : No data to display.  ?  ?  ?  ? ? ?Labs: ? ?Path: ? ?Echo: ? ?Heart Catheterization: ? ?Assessment & Plan:  ? ?Shortness of breath - Plan: ECHOCARDIOGRAM COMPLETE, CT CHEST HIGH RESOLUTION, Pulmonary Function Test ? ?Discussion: ?Scott [name] is a 66 year old male, former smoker with crohn's disease, OSA on CPAP and pernicious anemia who is referred to pulmonary clinic for shortness of breath.  ? ?The etiology of his dyspnea is unclear at this time. He has orthopnea and lower extremity edema concerning for heart failure. 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 No rales were  note on exam.  ? ?We will check high resolution CT Chest scan, pulmonary function tests and echocardiogram for further evaluation. ? ?Follow up in 4-6 weeks. ? ?Freda Jackson, MD ?[facility]

## 2021-05-20 NOTE — Patient Instructions (Signed)
We will check a high resolution CT Chest scan and echocardiogram (ultra sound of the heart) to further evaluate your shortness of breath.  ? ?Follow up in  4-6 weeks for office visit and pulmonary function tests ?

## 2021-06-01 ENCOUNTER — Ambulatory Visit (INDEPENDENT_AMBULATORY_CARE_PROVIDER_SITE_OTHER)
Admission: RE | Admit: 2021-06-01 | Discharge: 2021-06-01 | Disposition: A | Discharge status: Home / Self Care | Payer: BC Managed Care – PPO | Source: Ambulatory Visit | Attending: Pulmonary Disease | Admitting: Pulmonary Disease

## 2021-06-01 DIAGNOSIS — D0462 Carcinoma in situ of skin of left upper limb, including shoulder: Secondary | ICD-10-CM | POA: Diagnosis not present

## 2021-06-01 DIAGNOSIS — L570 Actinic keratosis: Secondary | ICD-10-CM | POA: Diagnosis not present

## 2021-06-01 DIAGNOSIS — I700 Atherosclerosis of aorta: Secondary | ICD-10-CM | POA: Diagnosis not present

## 2021-06-01 DIAGNOSIS — D0471 Carcinoma in situ of skin of right lower limb, including hip: Secondary | ICD-10-CM | POA: Diagnosis not present

## 2021-06-01 DIAGNOSIS — I2510 Atherosclerotic heart disease of native coronary artery without angina pectoris: Secondary | ICD-10-CM | POA: Diagnosis not present

## 2021-06-01 DIAGNOSIS — J849 Interstitial pulmonary disease, unspecified: Secondary | ICD-10-CM | POA: Diagnosis not present

## 2021-06-01 DIAGNOSIS — D045 Carcinoma in situ of skin of trunk: Secondary | ICD-10-CM | POA: Diagnosis not present

## 2021-06-01 DIAGNOSIS — R0602 Shortness of breath: Secondary | ICD-10-CM | POA: Diagnosis not present

## 2021-06-01 DIAGNOSIS — C44622 Squamous cell carcinoma of skin of right upper limb, including shoulder: Secondary | ICD-10-CM | POA: Diagnosis not present

## 2021-06-01 DIAGNOSIS — Z85828 Personal history of other malignant neoplasm of skin: Secondary | ICD-10-CM | POA: Diagnosis not present

## 2021-06-01 DIAGNOSIS — L821 Other seborrheic keratosis: Secondary | ICD-10-CM | POA: Diagnosis not present

## 2021-06-01 DIAGNOSIS — C44519 Basal cell carcinoma of skin of other part of trunk: Secondary | ICD-10-CM | POA: Diagnosis not present

## 2021-06-07 DIAGNOSIS — C44622 Squamous cell carcinoma of skin of right upper limb, including shoulder: Secondary | ICD-10-CM | POA: Diagnosis not present

## 2021-06-07 DIAGNOSIS — L570 Actinic keratosis: Secondary | ICD-10-CM | POA: Diagnosis not present

## 2021-06-09 ENCOUNTER — Ambulatory Visit (INDEPENDENT_AMBULATORY_CARE_PROVIDER_SITE_OTHER): Payer: BC Managed Care – PPO | Admitting: Adult Health

## 2021-06-09 ENCOUNTER — Encounter: Payer: Self-pay | Admitting: Adult Health

## 2021-06-09 VITALS — BP 147/86 | HR 76 | Ht 75.0 in | Wt 278.0 lb

## 2021-06-09 DIAGNOSIS — Z9989 Dependence on other enabling machines and devices: Secondary | ICD-10-CM | POA: Diagnosis not present

## 2021-06-09 DIAGNOSIS — G4733 Obstructive sleep apnea (adult) (pediatric): Secondary | ICD-10-CM | POA: Diagnosis not present

## 2021-06-09 NOTE — Patient Instructions (Signed)
Continue using CPAP nightly and greater than 4 hours each night °If your symptoms worsen or you develop new symptoms please let us know.  ° °

## 2021-06-09 NOTE — Progress Notes (Signed)
? ? ?PATIENT: Scott [name] ?DOB: 04/19/1955 ? ?REASON FOR VISIT: follow up ?HISTORY FROM: patient ? ?Chief Complaint  ?Patient presents with  ? Follow-up  ?  Pt in 20  pt is here for CPAP follow up . Pt has questions about inspire.   ? ? ? ?HISTORY OF PRESENT ILLNESS: ?Today 06/09/21: ? ?Mr. Scott [name] is a 66 year old male with a history of OSA on CPAP. CPAP is working well. Denies any new issues.  ? ? ? ?06/09/20: ? ?Mr. Scott [name] is a 66 year old male with a history of obstructive sleep apnea on CPAP.  He reports that the CPAP continues to work well for him.  He states that there are some nights he does not use it greater than 4 hours because he does not sleep more than 4 hours.  He lost his wife last June.  States that he is very lonely without her-understandably so they were married 40 years.  He joins me today for follow-up. ? ? ?05/30/19: Mr. Scott [name] is a 66 year old male with a history of obstructive sleep apnea on CPAP.  His download indicates that he use his machine nightly for compliance of 100%.  He uses machine greater than 4 hours 20 out of 30 days for compliance of 67%.  Her residual AHI is 1 on 8 cm of water with EPR 3.  Leak in the 95th percentile is 12.8 L/min.  He reports that the CPAP is working well for him.  Reports that he tends to stay up too late and therefore he does not use a CPAP for very long some nights. ? ? ?HISTORY (copied from Dr. Guadelupe Sabin note) ?02/28/17: I reviewed his CPAP compliance data from 01/28/2017 through 02/26/2017 which is a total of 30 days, during which time he used his home CPAP 18 days but also his travel CPAP. On the home CPAP his average usage was 5 hours and 47 minutes, residual AHI at goal at 1.1 per hour, leak acceptable with the 95th percentile at 9.3 L/m on a pressure of 8 cm with EPR of 3. A download is not possible from his travel CPAP. He is not sure if it is that at the same pressure level as his home CPAP. Of note, I provided him with a travel CPAP  prescription last year in December, it was supposed to be set at the same pressure level as his regular CPAP. He is advised to double check with his DME company. He reports doing well, compliant with CPAP, no recent illness, no major changes to medical Hx of medications, no longer on testosterone. Crohn?s stable. Tries to hydrate well with water.  ? ?REVIEW OF SYSTEMS: Out of a complete 14 system review of symptoms, the patient complains only of the following symptoms, and all other reviewed systems are negative. ? ?See FYI ? ?ALLERGIES: ?No Known Allergies ? ?HOME MEDICATIONS: ?Outpatient Medications Prior to Visit  ?Medication Sig Dispense Refill  ? azaTHIOprine (IMURAN) 50 MG tablet Take 200 mg by mouth daily.    ? CALCIUM PO Take 600 mg by mouth 2 (two) times daily.     ? Cholecalciferol (VITAMIN D3) 2000 UNITS TABS Take 2 tablets by mouth daily. Twice daily    ? colesevelam (WELCHOL) 625 MG tablet Take 1,875 mg by mouth 2 (two) times daily with a meal.     ? Cyanocobalamin (VITAMIN B 12) 100 MCG LOZG Place 1 tablet under the tongue daily.    ? Loperamide HCl (IMODIUM PO) Take 4  tablets by mouth 4 (four) times daily.    ? Multiple Vitamin (MULTIVITAMIN) capsule Take 1 capsule by mouth daily.    ? omeprazole (PRILOSEC) 20 MG capsule Take 20 mg by mouth daily.    ? POTASSIUM PO Take 1 tablet by mouth daily.     ? Probiotic Product (ALIGN) 4 MG CAPS Take 1 capsule by mouth daily.    ? tadalafil (CIALIS) 5 MG tablet Take 5 mg by mouth daily.    ? ?No facility-administered medications prior to visit.  ? ? ?PAST MEDICAL HISTORY: ?Past Medical History:  ?Diagnosis Date  ? Crohn's disease ([place])   ? OSA on CPAP 07/31/2013  ? Pernicious anemia   ? ? ?PAST SURGICAL HISTORY: ?Past Surgical History:  ?Procedure Laterality Date  ? BOWEL RESECTION    ? 2  ? ? ?FAMILY HISTORY: ?Family History  ?Problem Relation Age of Onset  ? Cancer Mother   ? Cancer Father   ? Heart failure Maternal Grandmother   ? Cancer Maternal Grandfather    ? Sleep apnea Father   ? Cancer Paternal Grandmother   ? Cancer Paternal Grandfather   ? COPD Brother   ? ? ?SOCIAL HISTORY: ?Social History  ? ?Socioeconomic History  ? Marital status: Widowed  ?  Spouse name: Threasa Beards  ? Number of children: 4  ? Years of education: 61  ? Highest education level: Not on file  ?Occupational History  ?  Comment: Attorney  ?Tobacco Use  ? Smoking status: Former  ?  Types: Cigarettes  ?  Quit date: 09/16/1978  ?  Years since quitting: 42.7  ? Smokeless tobacco: Never  ?Substance and Sexual Activity  ? Alcohol use: Not Currently  ?  Comment: occas.  ? Drug use: No  ? Sexual activity: Not on file  ?Other Topics Concern  ? Not on file  ?Social History Narrative  ? Patient consumes 1-2 cups caffeine daily  ? ?Social Determinants of Health  ? ?Financial Resource Strain: Not on file  ?Food Insecurity: Not on file  ?Transportation Needs: Not on file  ?Physical Activity: Not on file  ?Stress: Not on file  ?Social Connections: Not on file  ?Intimate Partner Violence: Not on file  ? ? ? ? ?PHYSICAL EXAM ? ?Vitals:  ? 06/09/21 0832  ?BP: (!) 147/86  ?Pulse: 76  ?Weight: 278 lb (126.1 kg)  ?Height: _0  (1.905 m)  ? ?Body mass index is 34.75 kg/m?. ? ?Generalized: Well developed, in no acute distress  ?Chest: Lungs clear to auscultation bilaterally ? ?Neurological examination  ?Mentation: Alert oriented to time, place, history taking. Follows all commands speech and language fluent ?Cranial nerve II-XII: Extraocular movements were full, visual field were full on confrontational test Head turning and shoulder shrug  were normal and symmetric. ?Motor: The motor testing reveals 5 over 5 strength of all 4 extremities. Good symmetric motor tone is noted throughout.  ?Sensory: Sensory testing is intact to soft touch on all 4 extremities. No evidence of extinction is noted.  ?Gait and station: Gait is normal.  ? ? ?DIAGNOSTIC DATA (LABS, IMAGING, TESTING) ?- I reviewed patient records, labs, notes,  testing and imaging myself where available. ? ?Lab Results  ?Component Value Date  ? WBC 5.0 05/24/2008  ? HGB 12.5 (L) 05/24/2008  ? HCT 35.2 (L) 05/24/2008  ? MCV 99.6 05/24/2008  ? PLT 151 05/24/2008  ? ?   ?Component Value Date/Time  ? NA 136 05/24/2008 0448  ? K 3.4 (L)  05/24/2008 0448  ? CL 105 05/24/2008 0448  ? CO2 26 05/24/2008 0448  ? GLUCOSE 108 (H) 05/24/2008 0448  ? BUN 6 05/24/2008 0448  ? CREATININE 0.80 05/29/2020 1643  ? CALCIUM 8.0 (L) 05/24/2008 0448  ? PROT 6.1 05/22/2008 1555  ? ALBUMIN 3.4 (L) 05/22/2008 1555  ? AST 26 05/22/2008 1555  ? ALT 18 05/22/2008 1555  ? ALKPHOS 75 05/22/2008 1555  ? BILITOT 1.0 05/22/2008 1555  ? GFRNONAA >60 05/24/2008 0448  ? GFRAA  05/24/2008 0448  ?  >60        ?The eGFR has been calculated ?using the MDRD equation. ?This calculation has not been ?validated in all clinical ?situations. ?eGFR's persistently ?<60 mL/min signify ?possible Chronic Kidney Disease.  ? ? ? ? ?ASSESSMENT AND PLAN ?66 y.o. year old male  has a past medical history of Crohn's disease ([place]), OSA on CPAP (07/31/2013), and Pernicious anemia. here with: ? ?OSA on CPAP ? ?- CPAP compliance- good ?- Good treatment of AHI  ?- Encourage patient to use CPAP nightly and > 4 hours each night ?- F/U in 1 year or sooner if needed ? ? ?Ward Givens, MSN, NP-C 06/09/2021, 8:43 AM ?[facility] ?[address], [address] ?[place], [place] [address] ?[phone] ? ? ? ? ? ? ? ?

## 2021-06-16 ENCOUNTER — Ambulatory Visit (HOSPITAL_COMMUNITY): Payer: BC Managed Care – PPO | Attending: Cardiology

## 2021-06-16 DIAGNOSIS — R0602 Shortness of breath: Secondary | ICD-10-CM | POA: Insufficient documentation

## 2021-06-16 LAB — ECHOCARDIOGRAM COMPLETE
Area-P 1/2: 3.61 cm2
S' Lateral: 2.7 cm

## 2021-06-30 DIAGNOSIS — R350 Frequency of micturition: Secondary | ICD-10-CM | POA: Diagnosis not present

## 2021-06-30 DIAGNOSIS — R3121 Asymptomatic microscopic hematuria: Secondary | ICD-10-CM | POA: Diagnosis not present

## 2021-07-07 ENCOUNTER — Ambulatory Visit (INDEPENDENT_AMBULATORY_CARE_PROVIDER_SITE_OTHER): Payer: BC Managed Care – PPO | Admitting: Pulmonary Disease

## 2021-07-07 ENCOUNTER — Encounter: Payer: Self-pay | Admitting: Pulmonary Disease

## 2021-07-07 VITALS — BP 126/72 | HR 79 | Ht 76.0 in | Wt 276.6 lb

## 2021-07-07 DIAGNOSIS — R0602 Shortness of breath: Secondary | ICD-10-CM

## 2021-07-07 DIAGNOSIS — J4520 Mild intermittent asthma, uncomplicated: Secondary | ICD-10-CM | POA: Diagnosis not present

## 2021-07-07 LAB — PULMONARY FUNCTION TEST
DL/VA % pred: 125 %
DL/VA: 5.04 ml/min/mmHg/L
DLCO cor % pred: 79 %
DLCO cor: 24.93 ml/min/mmHg
DLCO unc % pred: 79 %
DLCO unc: 24.93 ml/min/mmHg
FEF 25-75 Post: 1.75 L/sec
FEF 25-75 Pre: 1.99 L/sec
FEF2575-%Change-Post: -11 %
FEF2575-%Pred-Post: 54 %
FEF2575-%Pred-Pre: 61 %
FEV1-%Change-Post: -2 %
FEV1-%Pred-Post: 47 %
FEV1-%Pred-Pre: 48 %
FEV1-Post: 1.97 L
FEV1-Pre: 2.03 L
FEV1FVC-%Change-Post: 2 %
FEV1FVC-%Pred-Pre: 108 %
FEV6-%Change-Post: -5 %
FEV6-%Pred-Post: 44 %
FEV6-%Pred-Pre: 47 %
FEV6-Post: 2.39 L
FEV6-Pre: 2.53 L
FEV6FVC-%Pred-Post: 105 %
FEV6FVC-%Pred-Pre: 105 %
FVC-%Change-Post: -5 %
FVC-%Pred-Post: 42 %
FVC-%Pred-Pre: 45 %
FVC-Post: 2.39 L
FVC-Pre: 2.53 L
Post FEV1/FVC ratio: 83 %
Post FEV6/FVC ratio: 100 %
Pre FEV1/FVC ratio: 80 %
Pre FEV6/FVC Ratio: 100 %
RV % pred: 82 %
RV: 2.25 L
TLC % pred: 61 %
TLC: 5.09 L

## 2021-07-07 MED ORDER — BREZTRI AEROSPHERE 160-9-4.8 MCG/ACT IN AERO: 2.0000 | INHALATION_SPRAY | Freq: Two times a day (BID) | RESPIRATORY_TRACT | 6 refills | Status: DC

## 2021-07-07 MED ORDER — BREZTRI AEROSPHERE 160-9-4.8 MCG/ACT IN AERO: 2.0000 | INHALATION_SPRAY | Freq: Two times a day (BID) | RESPIRATORY_TRACT | 0 refills | Status: DC

## 2021-07-07 NOTE — Patient Instructions (Signed)
Full PFT Performed Today  

## 2021-07-07 NOTE — Progress Notes (Signed)
Synopsis: Referred in April 2023 for dyspnea by Alysia Penna, MD  Subjective:   PATIENT ID: Scott [name] GENDER: male DOB: [birth year], MRN: [id number]  Had PE when he was diagnosed with crohn's disease.   HPI  Chief Complaint  Patient presents with   Follow-up    Still experiencing SOB during mild exertion.    Scott [name] is a 66 year old male, former smoker with crohn's disease, OSA on CPAP and pernicious anemia who returns to pulmonary clinic for shortness of breath.   He continues to have exertional dyspnea with exertional wheezing.   Initial OV 05/20/21 Patient had covid 19 infection in April and September 2022 where he was treated with paxlovid both times. He has noticed on going dyspnea with exertion since these infections. He had some dyspnea prior to the infections but since the infections, the dyspnea is much worse. He reports a trip to the mountains where he noted increased dyspnea on hiking and required frequent stops to catch his breath. His heart rate would get up to 150-160 range. He notices dyspnea going up the stairs at home. He does have some wheezing with the dyspnea on exertion. Occasional cough. He has orthopnea and progressive lower extremity edema over the past 2 years.  He was trialed on symbicort for 2-3 weeks without noticeable improvement in his breathing.   He has history of pulmonary emboli in the 1970s when he had a crohn's flare. He is currently on imuran for crohn's disease. Denies any fevers, chills or sweats. He is using CPAP regularly for OSA and is followed at [facility].  He is a former smoker with 30 pack year history. He quit smoking in August 1980. 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 No history of harmful dust or chemical exposures.    Past Medical History:  Diagnosis Date   Crohn's disease ([facility])    OSA on CPAP 07/31/2013   Pernicious anemia      Family History  Problem Relation Age of  Onset   Cancer Mother    Cancer Father    Heart failure Maternal Grandmother    Cancer Maternal Grandfather    Sleep apnea Father    Cancer Paternal Grandmother    Cancer Paternal Grandfather    COPD Brother      Social History   Socioeconomic History   Marital status: Widowed    Spouse name: Shawna [place]   Number of children: 4   Years of education: 19   Highest education level: Not on file  Occupational History    Comment: Attorney  Tobacco Use   Smoking status: Former    Types: Cigarettes    Quit date: 09/16/1978    Years since quitting: 42.8   Smokeless tobacco: Never  Substance and Sexual Activity   Alcohol use: Not Currently    Comment: occas.   Drug use: No   Sexual activity: Not on file  Other Topics Concern   Not on file  Social History Narrative   Patient consumes 1-2 cups caffeine daily   Social Determinants of Health   Financial Resource Strain: Not on file  Food Insecurity: Not on file  Transportation Needs: Not on file  Physical Activity: Not on file  Stress: Not on file  Social Connections: Not on file  Intimate Partner Violence: Not on file     No Known Allergies   Outpatient Medications Prior to Visit  Medication Sig Dispense Refill   azaTHIOprine (IMURAN) 50  MG tablet Take 200 mg by mouth daily.     CALCIUM PO Take 600 mg by mouth 2 (two) times daily.      Cholecalciferol (VITAMIN D3) 2000 UNITS TABS Take 2 tablets by mouth daily. Twice daily     colesevelam (WELCHOL) 625 MG tablet Take 1,875 mg by mouth 2 (two) times daily with a meal.      Cyanocobalamin (VITAMIN B 12) 100 MCG LOZG Place 1 tablet under the tongue daily.     Loperamide HCl (IMODIUM PO) Take 4 tablets by mouth 4 (four) times daily.     Multiple Vitamin (MULTIVITAMIN) capsule Take 1 capsule by mouth daily.     omeprazole (PRILOSEC) 20 MG capsule Take 20 mg by mouth daily.     POTASSIUM PO Take 1 tablet by mouth daily.      Probiotic Product (ALIGN) 4 MG CAPS Take 1 capsule by  mouth daily.     tadalafil (CIALIS) 5 MG tablet Take 5 mg by mouth daily.     No facility-administered medications prior to visit.   Review of Systems  Constitutional:  Negative for chills, fever, malaise/fatigue and weight loss.  HENT:  Negative for congestion, sinus pain and sore throat.   Eyes: Negative.   Respiratory:  Positive for shortness of breath. Negative for cough, hemoptysis, sputum production and wheezing.   Cardiovascular:  Positive for orthopnea and leg swelling. Negative for chest pain, palpitations and claudication.  Gastrointestinal:  Negative for abdominal pain, heartburn, nausea and vomiting.  Genitourinary: Negative.   Musculoskeletal:  Negative for joint pain and myalgias.  Skin:  Negative for rash.  Neurological:  Negative for weakness.  Endo/Heme/Allergies: Negative.   Psychiatric/Behavioral: Negative.     Objective:   Vitals:   07/07/21 1608  BP: 126/72  Pulse: 79  SpO2: 96%  Weight: 276 lb 9.6 oz (125.5 kg)  Height: 6\' 4"  (1.93 m)   Physical Exam Constitutional:      General: He is not in acute distress.    Appearance: He is obese.  HENT:     Head: Normocephalic and atraumatic.  Eyes:     Extraocular Movements: Extraocular movements intact.     Conjunctiva/sclera: Conjunctivae normal.     Pupils: Pupils are equal, round, and reactive to light.  Cardiovascular:     Rate and Rhythm: Normal rate and regular rhythm.     Pulses: Normal pulses.     Heart sounds: Normal heart sounds. No murmur heard. Pulmonary:     Breath sounds: Decreased air movement present. Examination of the left-lower field reveals decreased breath sounds. Decreased breath sounds present.  Abdominal:     General: Bowel sounds are normal.     Palpations: Abdomen is soft.  Musculoskeletal:     Right lower leg: Edema present.     Left lower leg: Edema present.  Lymphadenopathy:     Cervical: No cervical adenopathy.  Skin:    General: Skin is warm and dry.  Neurological:      General: No focal deficit present.     Mental Status: He is alert.  Psychiatric:        Mood and Affect: Mood normal.        Behavior: Behavior normal.        Thought Content: Thought content normal.        Judgment: Judgment normal.   CBC    Component Value Date/Time   WBC 5.0 05/24/2008 0448   RBC 3.53 (L) 05/24/2008 0448   HGB  12.5 (L) 05/24/2008 0448   HCT 35.2 (L) 05/24/2008 0448   PLT 151 05/24/2008 0448   MCV 99.6 05/24/2008 0448   MCHC 35.5 05/24/2008 0448   RDW 14.5 05/24/2008 0448   LYMPHSABS 0.5 (L) 05/22/2008 1555   MONOABS 0.6 05/22/2008 1555   EOSABS 0.0 05/22/2008 1555   BASOSABS 0.0 05/22/2008 1555      Latest Ref Rng & Units 05/29/2020    4:43 PM 05/24/2008    4:48 AM 05/23/2008    4:03 AM  BMP  Glucose 70 - 99 mg/dL  071   219    BUN 6 - 23 mg/dL  6   9    Creatinine 7.58 - 1.24 mg/dL 8.32   5.49   8.26    Sodium 135 - 145 mEq/L  136   136    Potassium 3.5 - 5.1 mEq/L  3.4   3.7    Chloride 96 - 112 mEq/L  105   104    CO2 19 - 32 mEq/L  26   27    Calcium 8.4 - 10.5 mg/dL  8.0   8.0     Chest imaging: HRCT Chest 06/01/21 1. Bland appearing, bandlike scarring and volume loss of the bilateral lung bases with elevation of the left hemidiaphragm, consistent with sequelae of prior infection or aspiration and similar in appearance to the lung bases as included on both prior examination dated 05/29/2020 and 05/29/2008. No evidence of fibrotic interstitial lung disease. 2. Coronary artery disease. 3. Cholelithiasis.  CT Abdomen 05/29/20 Lower chest: Bibasilar linear consolidation consistent with scarring or atelectasis. No acute pleural or parenchymal lung disease.  PFT:    Latest Ref Rng & Units 07/07/2021    3:06 PM  PFT Results  FVC-Pre L 2.53    FVC-Predicted Pre % 45    FVC-Post L 2.39    FVC-Predicted Post % 42    Pre FEV1/FVC % % 80    Post FEV1/FCV % % 83    FEV1-Pre L 2.03    FEV1-Predicted Pre % 48    FEV1-Post L 1.97    DLCO  uncorrected ml/min/mmHg 24.93    DLCO UNC% % 79    DLCO corrected ml/min/mmHg 24.93    DLCO COR %Predicted % 79    DLVA Predicted % 125    TLC L 5.09    TLC % Predicted % 61    RV % Predicted % 82    PFT 2023: Mild restriction  Labs:  Path:  Echo 06/16/21: LVEF 60-65%. LV diastolic parameters are normal. RV systolic function is normal. RV size is normal.   Heart Catheterization:  Assessment & Plan:   Shortness of breath  Mild intermittent reactive airway disease without complication - Plan: Budeson-Glycopyrrol-Formoterol (BREZTRI AEROSPHERE) 160-9-4.8 MCG/ACT AERO  Discussion: Scott [name] is a 66 year old male, former smoker with crohn's disease, OSA on CPAP and pernicious anemia who returns to pulmonary clinic for shortness of breath.   The etiology of his dyspnea is likely related to his elevated left hemidiaphragm and recent weight gain over the past year and a half. No evidence of interstitial lung disease. Mild basilar scarring has been present since 2010 upon review of abdominal imaging. His echo is unremarkable.   He may have a component of reactive airways disease given wheezing on exertion. We will trial him on breztri inhaler 2 puffs twice daily with spacer.   Follow up in 3 months.  Melody Comas, MD [facility] Office: [phone]   Current Outpatient Medications:    azaTHIOprine (IMURAN) 50 MG tablet, Take 200 mg by mouth daily., Disp: , Rfl:    Budeson-Glycopyrrol-Formoterol (BREZTRI AEROSPHERE) 160-9-4.8 MCG/ACT AERO, Inhale 2 puffs into the lungs in the morning and at bedtime., Disp: 10.7 g, Rfl: 6   Budeson-Glycopyrrol-Formoterol (BREZTRI AEROSPHERE) 160-9-4.8 MCG/ACT AERO, Inhale 2 puffs into the lungs in the morning and at bedtime., Disp: 11.8 g, Rfl: 0   CALCIUM PO, Take 600 mg by mouth 2 (two) times daily. , Disp: , Rfl:    Cholecalciferol (VITAMIN D3) 2000 UNITS TABS, Take 2 tablets by mouth daily. Twice daily, Disp: ,  Rfl:    colesevelam (WELCHOL) 625 MG tablet, Take 1,875 mg by mouth 2 (two) times daily with a meal. , Disp: , Rfl:    Cyanocobalamin (VITAMIN B 12) 100 MCG LOZG, Place 1 tablet under the tongue daily., Disp: , Rfl:    Loperamide HCl (IMODIUM PO), Take 4 tablets by mouth 4 (four) times daily., Disp: , Rfl:    Multiple Vitamin (MULTIVITAMIN) capsule, Take 1 capsule by mouth daily., Disp: , Rfl:    omeprazole (PRILOSEC) 20 MG capsule, Take 20 mg by mouth daily., Disp: , Rfl:    POTASSIUM PO, Take 1 tablet by mouth daily. , Disp: , Rfl:    Probiotic Product (ALIGN) 4 MG CAPS, Take 1 capsule by mouth daily., Disp: , Rfl:    tadalafil (CIALIS) 5 MG tablet, Take 5 mg by mouth daily., Disp: , Rfl:

## 2021-07-07 NOTE — Progress Notes (Signed)
Full PFT Performed Today  

## 2021-07-07 NOTE — Patient Instructions (Addendum)
The elevated left hemidiaphragm and weight gain have likely led to your increase in shortness of breath. There may also be a component of reactive airways disease given your symptoms of wheezing.  Try breztri inhaler 2 puffs twice daily with spacer - rinse mouth out after each use  Recommend using the breztri for at least 1 month to determine if it is helping with your wheezing.   Follow up in 3 months

## 2021-07-28 DIAGNOSIS — H02834 Dermatochalasis of left upper eyelid: Secondary | ICD-10-CM | POA: Diagnosis not present

## 2021-07-28 DIAGNOSIS — H43811 Vitreous degeneration, right eye: Secondary | ICD-10-CM | POA: Diagnosis not present

## 2021-07-28 DIAGNOSIS — H5213 Myopia, bilateral: Secondary | ICD-10-CM | POA: Diagnosis not present

## 2021-07-28 DIAGNOSIS — H02831 Dermatochalasis of right upper eyelid: Secondary | ICD-10-CM | POA: Diagnosis not present

## 2021-08-17 DIAGNOSIS — G4733 Obstructive sleep apnea (adult) (pediatric): Secondary | ICD-10-CM | POA: Diagnosis not present

## 2021-08-20 ENCOUNTER — Other Ambulatory Visit: Payer: Self-pay | Admitting: *Deleted

## 2021-08-20 DIAGNOSIS — J4520 Mild intermittent asthma, uncomplicated: Secondary | ICD-10-CM

## 2021-08-20 MED ORDER — BREZTRI AEROSPHERE 160-9-4.8 MCG/ACT IN AERO: 2.0000 | INHALATION_SPRAY | Freq: Two times a day (BID) | RESPIRATORY_TRACT | 3 refills | Status: DC

## 2021-08-31 DIAGNOSIS — K5180 Other ulcerative colitis without complications: Secondary | ICD-10-CM | POA: Diagnosis not present

## 2021-09-29 DIAGNOSIS — R3121 Asymptomatic microscopic hematuria: Secondary | ICD-10-CM | POA: Diagnosis not present

## 2021-09-29 DIAGNOSIS — N5201 Erectile dysfunction due to arterial insufficiency: Secondary | ICD-10-CM | POA: Diagnosis not present

## 2021-10-07 ENCOUNTER — Encounter: Payer: Self-pay | Admitting: Pulmonary Disease

## 2021-10-07 ENCOUNTER — Ambulatory Visit (INDEPENDENT_AMBULATORY_CARE_PROVIDER_SITE_OTHER): Payer: BC Managed Care – PPO | Admitting: Pulmonary Disease

## 2021-10-07 VITALS — BP 134/64 | HR 70 | Temp 97.7°F | Ht 75.0 in | Wt 266.0 lb

## 2021-10-07 DIAGNOSIS — R942 Abnormal results of pulmonary function studies: Secondary | ICD-10-CM | POA: Diagnosis not present

## 2021-10-07 DIAGNOSIS — J4520 Mild intermittent asthma, uncomplicated: Secondary | ICD-10-CM

## 2021-10-07 MED ORDER — BREZTRI AEROSPHERE 160-9-4.8 MCG/ACT IN AERO: 2.0000 | INHALATION_SPRAY | Freq: Two times a day (BID) | RESPIRATORY_TRACT | 3 refills | Status: DC

## 2021-10-07 NOTE — Patient Instructions (Addendum)
Continue breztrei inhaler with spacer 2 puffs twice daily - rinse mouth out after each use  If cough worsens please give [place] a call  Continue to work on weight loss, I am happy with the current progress.   We will repeat a CT Chest scan in May or June 2024 to monitor non-specific inflammation of your lower lobes.  Follow up in 9 months after CT chest. Please call if symptoms worsen to be scheduled for earlier visit.

## 2021-10-07 NOTE — Progress Notes (Signed)
Synopsis: Referred in April 2023 for dyspnea by Velna Hatchet, MD  Subjective:   PATIENT ID: Scott [name] GENDER: male DOB: 1955-11-29, MRN: [id number]  HPI  Chief Complaint  Patient presents with   Follow-up    Pt is here for follow up for SOB. Pt states he has been trying to loose weight and has been using the [place] daily. Pt states that now after starting the [facility] he has developed a dry cough.    Scott [name] is a 66 year old male, former smoker with crohn's disease, OSA on CPAP and pernicious anemia who returns to pulmonary clinic for shortness of breath.   He reports improvement with the wheezing and dyspnea since starting breztri inhaler 2 puffs twice daily. He has noticed increased cough with mucous production with since starting the inhaler. He is walking more. He has lost 10lbs since last visit. He is eating better and dating a new woman.   OV 07/07/21 He continues to have exertional dyspnea with exertional wheezing.   Initial OV 05/20/21 Patient had covid 19 infection in April and September 2022 where he was treated with paxlovid both times. He has noticed on going dyspnea with exertion since these infections. He had some dyspnea prior to the infections but since the infections, the dyspnea is much worse. He reports a trip to the mountains where he noted increased dyspnea on hiking and required frequent stops to catch his breath. His heart rate would get up to 150-160 range. He notices dyspnea going up the stairs at home. He does have some wheezing with the dyspnea on exertion. Occasional cough. He has orthopnea and progressive lower extremity edema over the past 2 years.  He was trialed on symbicort for 2-3 weeks without noticeable improvement in his breathing.   He has history of pulmonary emboli in the 1970s when he had a crohn's flare. He is currently on imuran for crohn's disease. Denies any fevers, chills or sweats. He is using CPAP regularly for OSA and is  followed at [facility].  He is a former smoker with 30 pack year history. He quit smoking in August 1980. 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 No history of harmful dust or chemical exposures.    Past Medical History:  Diagnosis Date   Crohn's disease ([place])    OSA on CPAP 07/31/2013   Pernicious anemia      Family History  Problem Relation Age of Onset   Cancer Mother    Cancer Father    Heart failure Maternal Grandmother    Cancer Maternal Grandfather    Sleep apnea Father    Cancer Paternal Grandmother    Cancer Paternal Grandfather    COPD Brother      Social History   Socioeconomic History   Marital status: Widowed    Spouse name: Threasa Beards   Number of children: 4   Years of education: 35   Highest education level: Not on file  Occupational History    Comment: Attorney  Tobacco Use   Smoking status: Former    Types: Cigarettes    Quit date: 09/16/1978    Years since quitting: 43.0   Smokeless tobacco: Never  Substance and Sexual Activity   Alcohol use: Not Currently    Comment: occas.   Drug use: No   Sexual activity: Not on file  Other Topics Concern   Not on file  Social History Narrative   Patient consumes 1-2 cups caffeine daily  Social Determinants of Health   Financial Resource Strain: Not on file  Food Insecurity: Not on file  Transportation Needs: Not on file  Physical Activity: Not on file  Stress: Not on file  Social Connections: Not on file  Intimate Partner Violence: Not on file     No Known Allergies   Outpatient Medications Prior to Visit  Medication Sig Dispense Refill   azaTHIOprine (IMURAN) 50 MG tablet Take 200 mg by mouth daily.     CALCIUM PO Take 600 mg by mouth 2 (two) times daily.      Cholecalciferol (VITAMIN D3) 2000 UNITS TABS Take 2 tablets by mouth daily. Twice daily     colesevelam (WELCHOL) 625 MG tablet Take 1,875 mg by mouth 2 (two) times daily with a meal.      Cyanocobalamin  (VITAMIN B 12) 100 MCG LOZG Place 1 tablet under the tongue daily.     Loperamide HCl (IMODIUM PO) Take 4 tablets by mouth 4 (four) times daily.     Multiple Vitamin (MULTIVITAMIN) capsule Take 1 capsule by mouth daily.     omeprazole (PRILOSEC) 20 MG capsule Take 20 mg by mouth daily.     POTASSIUM PO Take 1 tablet by mouth daily.      Probiotic Product (ALIGN) 4 MG CAPS Take 1 capsule by mouth daily.     tadalafil (CIALIS) 5 MG tablet Take 5 mg by mouth daily.     Budeson-Glycopyrrol-Formoterol (BREZTRI AEROSPHERE) 160-9-4.8 MCG/ACT AERO Inhale 2 puffs into the lungs in the morning and at bedtime. 32.1 g 3   No facility-administered medications prior to visit.   Review of Systems  Constitutional:  Negative for chills, fever, malaise/fatigue and weight loss.  HENT:  Negative for congestion, sinus pain and sore throat.   Eyes: Negative.   Respiratory:  Negative for cough, hemoptysis, sputum production, shortness of breath and wheezing.   Cardiovascular:  Negative for chest pain, palpitations, orthopnea, claudication and leg swelling.  Gastrointestinal:  Negative for abdominal pain, heartburn, nausea and vomiting.  Genitourinary: Negative.   Musculoskeletal:  Negative for joint pain and myalgias.  Skin:  Negative for rash.  Neurological:  Negative for weakness.  Endo/Heme/Allergies: Negative.   Psychiatric/Behavioral: Negative.      Objective:   Vitals:   10/07/21 1043  BP: 134/64  Pulse: 70  Temp: 97.7 F (36.5 C)  TempSrc: Oral  SpO2: 94%  Weight: 266 lb (120.7 kg)  Height: 6\' 3"  (1.905 m)   Physical Exam Constitutional:      General: He is not in acute distress.    Appearance: He is obese.  HENT:     Head: Normocephalic and atraumatic.  Eyes:     Extraocular Movements: Extraocular movements intact.     Conjunctiva/sclera: Conjunctivae normal.     Pupils: Pupils are equal, round, and reactive to light.  Cardiovascular:     Rate and Rhythm: Normal rate and regular  rhythm.     Pulses: Normal pulses.     Heart sounds: Normal heart sounds. No murmur heard. Abdominal:     General: Bowel sounds are normal.     Palpations: Abdomen is soft.  Musculoskeletal:     Right lower leg: Edema (trace) present.     Left lower leg: Edema (trace) present.  Lymphadenopathy:     Cervical: No cervical adenopathy.  Skin:    General: Skin is warm and dry.  Neurological:     General: No focal deficit present.     Mental  Status: He is alert.  Psychiatric:        Mood and Affect: Mood normal.        Behavior: Behavior normal.        Thought Content: Thought content normal.        Judgment: Judgment normal.    CBC    Component Value Date/Time   WBC 5.0 05/24/2008 0448   RBC 3.53 (L) 05/24/2008 0448   HGB 12.5 (L) 05/24/2008 0448   HCT 35.2 (L) 05/24/2008 0448   PLT 151 05/24/2008 0448   MCV 99.6 05/24/2008 0448   MCHC 35.5 05/24/2008 0448   RDW 14.5 05/24/2008 0448   LYMPHSABS 0.5 (L) 05/22/2008 1555   MONOABS 0.6 05/22/2008 1555   EOSABS 0.0 05/22/2008 1555   BASOSABS 0.0 05/22/2008 1555      Latest Ref Rng & Units 05/29/2020    4:43 PM 05/24/2008    4:48 AM 05/23/2008    4:03 AM  BMP  Glucose 70 - 99 mg/dL  789  381   BUN 6 - 23 mg/dL  6  9   Creatinine 0.17 - 1.24 mg/dL 5.10  2.58  5.27   Sodium 135 - 145 mEq/L  136  136   Potassium 3.5 - 5.1 mEq/L  3.4  3.7   Chloride 96 - 112 mEq/L  105  104   CO2 19 - 32 mEq/L  26  27   Calcium 8.4 - 10.5 mg/dL  8.0  8.0    Chest imaging: HRCT Chest 06/01/21 1. Bland appearing, bandlike scarring and volume loss of the bilateral lung bases with elevation of the left hemidiaphragm, consistent with sequelae of prior infection or aspiration and similar in appearance to the lung bases as included on both prior examination dated 05/29/2020 and 05/29/2008. No evidence of fibrotic interstitial lung disease. 2. Coronary artery disease. 3. Cholelithiasis.  CT Abdomen 05/29/20 Lower chest: Bibasilar linear  consolidation consistent with scarring or atelectasis. No acute pleural or parenchymal lung disease.  PFT:    Latest Ref Rng & Units 07/07/2021    3:06 PM  PFT Results  FVC-Pre L 2.53   FVC-Predicted Pre % 45   FVC-Post L 2.39   FVC-Predicted Post % 42   Pre FEV1/FVC % % 80   Post FEV1/FCV % % 83   FEV1-Pre L 2.03   FEV1-Predicted Pre % 48   FEV1-Post L 1.97   DLCO uncorrected ml/min/mmHg 24.93   DLCO UNC% % 79   DLCO corrected ml/min/mmHg 24.93   DLCO COR %Predicted % 79   DLVA Predicted % 125   TLC L 5.09   TLC % Predicted % 61   RV % Predicted % 82   PFT 2023: Mild restriction  Labs:  Path:  Echo 06/16/21: LVEF 60-65%. LV diastolic parameters are normal. RV systolic function is normal. RV size is normal.   Heart Catheterization:  Assessment & Plan:   Mild intermittent reactive airway disease without complication - Plan: Budeson-Glycopyrrol-Formoterol (BREZTRI AEROSPHERE) 160-9-4.8 MCG/ACT AERO  Restrictive ventilatory defect - Plan: CT CHEST HIGH RESOLUTION  Discussion: Scott [name] is a 66 year old male, former smoker with crohn's disease, OSA on CPAP and pernicious anemia who returns to pulmonary clinic for shortness of breath.   The etiology of his dyspnea is likely related to his elevated left hemidiaphragm and recent weight gain over the past year and a half which is displayed by the mild restrictive defect on PFTs. No evidence of interstitial lung disease. Mild basilar  scarring has been present since 2010 upon review of abdominal imaging. His echo is unremarkable.   He has lost about 10lbs since last visit and is feeling better along with using breztri 2 puffs twice daily which has helped alleviate wheezing. He likely has component of reactive airways disease.  We will repeat a HRCT chest next May/June to follow up lower lobe findings. -  Follow up in 9 months.  Freda Jackson, MD [facility] Office: [phone]   Current  Outpatient Medications:    azaTHIOprine (IMURAN) 50 MG tablet, Take 200 mg by mouth daily., Disp: , Rfl:    CALCIUM PO, Take 600 mg by mouth 2 (two) times daily. , Disp: , Rfl:    Cholecalciferol (VITAMIN D3) 2000 UNITS TABS, Take 2 tablets by mouth daily. Twice daily, Disp: , Rfl:    colesevelam (WELCHOL) 625 MG tablet, Take 1,875 mg by mouth 2 (two) times daily with a meal. , Disp: , Rfl:    Cyanocobalamin (VITAMIN B 12) 100 MCG LOZG, Place 1 tablet under the tongue daily., Disp: , Rfl:    Loperamide HCl (IMODIUM PO), Take 4 tablets by mouth 4 (four) times daily., Disp: , Rfl:    Multiple Vitamin (MULTIVITAMIN) capsule, Take 1 capsule by mouth daily., Disp: , Rfl:    omeprazole (PRILOSEC) 20 MG capsule, Take 20 mg by mouth daily., Disp: , Rfl:    POTASSIUM PO, Take 1 tablet by mouth daily. , Disp: , Rfl:    Probiotic Product (ALIGN) 4 MG CAPS, Take 1 capsule by mouth daily., Disp: , Rfl:    tadalafil (CIALIS) 5 MG tablet, Take 5 mg by mouth daily., Disp: , Rfl:    Budeson-Glycopyrrol-Formoterol (BREZTRI AEROSPHERE) 160-9-4.8 MCG/ACT AERO, Inhale 2 puffs into the lungs in the morning and at bedtime., Disp: 32.1 g, Rfl: 3

## 2021-10-12 DIAGNOSIS — E559 Vitamin D deficiency, unspecified: Secondary | ICD-10-CM | POA: Diagnosis not present

## 2021-10-12 DIAGNOSIS — Z0000 Encounter for general adult medical examination without abnormal findings: Secondary | ICD-10-CM | POA: Diagnosis not present

## 2021-10-12 DIAGNOSIS — R7989 Other specified abnormal findings of blood chemistry: Secondary | ICD-10-CM | POA: Diagnosis not present

## 2021-10-12 DIAGNOSIS — E538 Deficiency of other specified B group vitamins: Secondary | ICD-10-CM | POA: Diagnosis not present

## 2021-10-12 DIAGNOSIS — E291 Testicular hypofunction: Secondary | ICD-10-CM | POA: Diagnosis not present

## 2021-10-19 DIAGNOSIS — Z0000 Encounter for general adult medical examination without abnormal findings: Secondary | ICD-10-CM | POA: Diagnosis not present

## 2021-10-19 DIAGNOSIS — Z1331 Encounter for screening for depression: Secondary | ICD-10-CM | POA: Diagnosis not present

## 2021-10-19 DIAGNOSIS — E559 Vitamin D deficiency, unspecified: Secondary | ICD-10-CM | POA: Diagnosis not present

## 2021-10-19 DIAGNOSIS — Z1339 Encounter for screening examination for other mental health and behavioral disorders: Secondary | ICD-10-CM | POA: Diagnosis not present

## 2021-10-21 DIAGNOSIS — Z980 Intestinal bypass and anastomosis status: Secondary | ICD-10-CM | POA: Diagnosis not present

## 2021-10-21 DIAGNOSIS — K624 Stenosis of anus and rectum: Secondary | ICD-10-CM | POA: Diagnosis not present

## 2021-10-21 DIAGNOSIS — K5010 Crohn's disease of large intestine without complications: Secondary | ICD-10-CM | POA: Diagnosis not present

## 2021-10-29 DIAGNOSIS — Z23 Encounter for immunization: Secondary | ICD-10-CM | POA: Diagnosis not present

## 2021-11-17 DIAGNOSIS — G4733 Obstructive sleep apnea (adult) (pediatric): Secondary | ICD-10-CM | POA: Diagnosis not present

## 2021-12-06 DIAGNOSIS — D0471 Carcinoma in situ of skin of right lower limb, including hip: Secondary | ICD-10-CM | POA: Diagnosis not present

## 2021-12-06 DIAGNOSIS — D044 Carcinoma in situ of skin of scalp and neck: Secondary | ICD-10-CM | POA: Diagnosis not present

## 2021-12-06 DIAGNOSIS — Z85828 Personal history of other malignant neoplasm of skin: Secondary | ICD-10-CM | POA: Diagnosis not present

## 2021-12-06 DIAGNOSIS — L570 Actinic keratosis: Secondary | ICD-10-CM | POA: Diagnosis not present

## 2021-12-06 DIAGNOSIS — L821 Other seborrheic keratosis: Secondary | ICD-10-CM | POA: Diagnosis not present

## 2021-12-06 DIAGNOSIS — L565 Disseminated superficial actinic porokeratosis (DSAP): Secondary | ICD-10-CM | POA: Diagnosis not present

## 2021-12-06 DIAGNOSIS — C44612 Basal cell carcinoma of skin of right upper limb, including shoulder: Secondary | ICD-10-CM | POA: Diagnosis not present

## 2021-12-14 DIAGNOSIS — R3915 Urgency of urination: Secondary | ICD-10-CM | POA: Diagnosis not present

## 2021-12-14 DIAGNOSIS — R350 Frequency of micturition: Secondary | ICD-10-CM | POA: Diagnosis not present

## 2021-12-14 DIAGNOSIS — N5201 Erectile dysfunction due to arterial insufficiency: Secondary | ICD-10-CM | POA: Diagnosis not present

## 2022-02-17 DIAGNOSIS — G4733 Obstructive sleep apnea (adult) (pediatric): Secondary | ICD-10-CM | POA: Diagnosis not present

## 2022-03-18 DIAGNOSIS — K5010 Crohn's disease of large intestine without complications: Secondary | ICD-10-CM | POA: Diagnosis not present

## 2022-05-17 DIAGNOSIS — K5010 Crohn's disease of large intestine without complications: Secondary | ICD-10-CM | POA: Diagnosis not present

## 2022-05-19 DIAGNOSIS — G4733 Obstructive sleep apnea (adult) (pediatric): Secondary | ICD-10-CM | POA: Diagnosis not present

## 2022-06-09 DIAGNOSIS — L570 Actinic keratosis: Secondary | ICD-10-CM | POA: Diagnosis not present

## 2022-06-09 DIAGNOSIS — C44519 Basal cell carcinoma of skin of other part of trunk: Secondary | ICD-10-CM | POA: Diagnosis not present

## 2022-06-09 DIAGNOSIS — C44619 Basal cell carcinoma of skin of left upper limb, including shoulder: Secondary | ICD-10-CM | POA: Diagnosis not present

## 2022-06-09 DIAGNOSIS — D1801 Hemangioma of skin and subcutaneous tissue: Secondary | ICD-10-CM | POA: Diagnosis not present

## 2022-06-09 DIAGNOSIS — Z85828 Personal history of other malignant neoplasm of skin: Secondary | ICD-10-CM | POA: Diagnosis not present

## 2022-06-09 DIAGNOSIS — L821 Other seborrheic keratosis: Secondary | ICD-10-CM | POA: Diagnosis not present

## 2022-06-14 NOTE — Progress Notes (Unsigned)
PATIENT: Scott [name] DOB: 04-22-1955  REASON FOR VISIT: follow up HISTORY FROM: patient  No chief complaint on file.    HISTORY OF PRESENT ILLNESS: Today 06/14/22:  Scott [name] is a 67 y.o. male with a history of OSA on CPAP. Returns today for follow-up.      06/09/21: Scott [name] is a 67 year old male with a history of OSA on CPAP. CPAP is working well. Denies any new issues.     06/09/20:  Scott [name] is a 67 year old male with a history of obstructive sleep apnea on CPAP.  He reports that the CPAP continues to work well for him.  He states that there are some nights he does not use it greater than 4 hours because he does not sleep more than 4 hours.  He lost his wife last June.  States that he is very lonely without her-understandably so they were married 40 years.  He joins me today for follow-up.   05/30/19: Scott [name] is a 67 year old male with a history of obstructive sleep apnea on CPAP.  His download indicates that he use his machine nightly for compliance of 100%.  He uses machine greater than 4 hours 20 out of 30 days for compliance of 67%.  Her residual AHI is 1 on 8 cm of water with EPR 3.  Leak in the 95th percentile is 12.8 L/min.  He reports that the CPAP is working well for him.  Reports that he tends to stay up too late and therefore he does not use a CPAP for very long some nights.   HISTORY (copied from Dr. Teofilo Pod note) 02/28/17: I reviewed his CPAP compliance data from 01/28/2017 through 02/26/2017 which is a total of 30 days, during which time he used his home CPAP 18 days but also his travel CPAP. On the home CPAP his average usage was 5 hours and 47 minutes, residual AHI at goal at 1.1 per hour, leak acceptable with the 95th percentile at 9.3 L/m on a pressure of 8 cm with EPR of 3. A download is not possible from his travel CPAP. He is not sure if it is that at the same pressure level as his home CPAP. Of note, I provided him with  a travel CPAP prescription last year in December, it was supposed to be set at the same pressure level as his regular CPAP. He is advised to double check with his DME company. He reports doing well, compliant with CPAP, no recent illness, no major changes to medical Hx of medications, no longer on testosterone. Crohn's stable. Tries to hydrate well with water.   REVIEW OF SYSTEMS: Out of a complete 14 system review of symptoms, the patient complains only of the following symptoms, and all other reviewed systems are negative.  See FYI  ALLERGIES: No Known Allergies  HOME MEDICATIONS: Outpatient Medications Prior to Visit  Medication Sig Dispense Refill   azaTHIOprine (IMURAN) 50 MG tablet Take 200 mg by mouth daily.     Budeson-Glycopyrrol-Formoterol (BREZTRI AEROSPHERE) 160-9-4.8 MCG/ACT AERO Inhale 2 puffs into the lungs in the morning and at bedtime. 32.1 g 3   CALCIUM PO Take 600 mg by mouth 2 (two) times daily.      Cholecalciferol (VITAMIN D3) 2000 UNITS TABS Take 2 tablets by mouth daily. Twice daily     colesevelam (WELCHOL) 625 MG tablet Take 1,875 mg by mouth 2 (two) times daily with a meal.      Cyanocobalamin (VITAMIN  B 12) 100 MCG LOZG Place 1 tablet under the tongue daily.     Loperamide HCl (IMODIUM PO) Take 4 tablets by mouth 4 (four) times daily.     Multiple Vitamin (MULTIVITAMIN) capsule Take 1 capsule by mouth daily.     omeprazole (PRILOSEC) 20 MG capsule Take 20 mg by mouth daily.     POTASSIUM PO Take 1 tablet by mouth daily.      Probiotic Product (ALIGN) 4 MG CAPS Take 1 capsule by mouth daily.     tadalafil (CIALIS) 5 MG tablet Take 5 mg by mouth daily.     No facility-administered medications prior to visit.    PAST MEDICAL HISTORY: Past Medical History:  Diagnosis Date   Crohn's disease ([facility])    OSA on CPAP 07/31/2013   Pernicious anemia     PAST SURGICAL HISTORY: Past Surgical History:  Procedure Laterality Date   BOWEL RESECTION     2    FAMILY  HISTORY: Family History  Problem Relation Age of Onset   Cancer Mother    Cancer Father    Heart failure Maternal Grandmother    Cancer Maternal Grandfather    Sleep apnea Father    Cancer Paternal Grandmother    Cancer Paternal Grandfather    COPD Brother     SOCIAL HISTORY: Social History   Socioeconomic History   Marital status: Widowed    Spouse name: Shawna Orleans   Number of children: 4   Years of education: 19   Highest education level: Not on file  Occupational History    Comment: Attorney  Tobacco Use   Smoking status: Former    Types: Cigarettes    Quit date: 09/16/1978    Years since quitting: 43.7   Smokeless tobacco: Never  Substance and Sexual Activity   Alcohol use: Not Currently    Comment: occas.   Drug use: No   Sexual activity: Not on file  Other Topics Concern   Not on file  Social History Narrative   Patient consumes 1-2 cups caffeine daily   Social Determinants of Health   Financial Resource Strain: Not on file  Food Insecurity: Not on file  Transportation Needs: Not on file  Physical Activity: Not on file  Stress: Not on file  Social Connections: Not on file  Intimate Partner Violence: Not on file      PHYSICAL EXAM  There were no vitals filed for this visit.  There is no height or weight on file to calculate BMI.  Generalized: Well developed, in no acute distress  Chest: Lungs clear to auscultation bilaterally  Neurological examination  Mentation: Alert oriented to time, place, history taking. Follows all commands speech and language fluent Cranial nerve II-XII: Extraocular movements were full, visual field were full on confrontational test Head turning and shoulder shrug  were normal and symmetric. Motor: The motor testing reveals 5 over 5 strength of all 4 extremities. Good symmetric motor tone is noted throughout.  Sensory: Sensory testing is intact to soft touch on all 4 extremities. No evidence of extinction is noted.  Gait and  station: Gait is normal.    DIAGNOSTIC DATA (LABS, IMAGING, TESTING) - I reviewed patient records, labs, notes, testing and imaging myself where available.  Lab Results  Component Value Date   WBC 5.0 05/24/2008   HGB 12.5 (L) 05/24/2008   HCT 35.2 (L) 05/24/2008   MCV 99.6 05/24/2008   PLT 151 05/24/2008      Component Value Date/Time  NA 136 05/24/2008 0448   K 3.4 (L) 05/24/2008 0448   CL 105 05/24/2008 0448   CO2 26 05/24/2008 0448   GLUCOSE 108 (H) 05/24/2008 0448   BUN 6 05/24/2008 0448   CREATININE 0.80 05/29/2020 1643   CALCIUM 8.0 (L) 05/24/2008 0448   PROT 6.1 05/22/2008 1555   ALBUMIN 3.4 (L) 05/22/2008 1555   AST 26 05/22/2008 1555   ALT 18 05/22/2008 1555   ALKPHOS 75 05/22/2008 1555   BILITOT 1.0 05/22/2008 1555   GFRNONAA >60 05/24/2008 0448   GFRAA  05/24/2008 0448    >60        The eGFR has been calculated using the MDRD equation. This calculation has not been validated in all clinical situations. eGFR's persistently <60 mL/min signify possible Chronic Kidney Disease.      ASSESSMENT AND PLAN 67 y.o. year old male  has a past medical history of Crohn's disease (HCC), OSA on CPAP (07/31/2013), and Pernicious anemia. here with:  OSA on CPAP  - CPAP compliance- good - Good treatment of AHI  - Encourage patient to use CPAP nightly and > 4 hours each night - F/U in 1 year or sooner if needed   Butch Penny, MSN, NP-C 06/14/2022, 4:41 PM [facility] [address] [place], [place] [address] [phone]

## 2022-06-15 ENCOUNTER — Encounter: Payer: Self-pay | Admitting: Adult Health

## 2022-06-15 ENCOUNTER — Ambulatory Visit (INDEPENDENT_AMBULATORY_CARE_PROVIDER_SITE_OTHER): Payer: BC Managed Care – PPO | Admitting: Adult Health

## 2022-06-15 VITALS — BP 130/77 | HR 70 | Ht 75.0 in | Wt 245.0 lb

## 2022-06-15 DIAGNOSIS — G4733 Obstructive sleep apnea (adult) (pediatric): Secondary | ICD-10-CM | POA: Diagnosis not present

## 2022-06-15 NOTE — Patient Instructions (Signed)
Continue using CPAP nightly and greater than 4 hours each night °If your symptoms worsen or you develop new symptoms please let us know.  ° °

## 2022-06-16 DIAGNOSIS — K5010 Crohn's disease of large intestine without complications: Secondary | ICD-10-CM | POA: Diagnosis not present

## 2022-06-21 DIAGNOSIS — Z713 Dietary counseling and surveillance: Secondary | ICD-10-CM | POA: Diagnosis not present

## 2022-07-11 ENCOUNTER — Ambulatory Visit (HOSPITAL_COMMUNITY)
Admission: RE | Admit: 2022-07-11 | Discharge: 2022-07-11 | Disposition: A | Discharge status: Home / Self Care | Payer: BC Managed Care – PPO | Source: Ambulatory Visit | Attending: Pulmonary Disease | Admitting: Pulmonary Disease

## 2022-07-11 DIAGNOSIS — R942 Abnormal results of pulmonary function studies: Secondary | ICD-10-CM | POA: Insufficient documentation

## 2022-07-11 DIAGNOSIS — I700 Atherosclerosis of aorta: Secondary | ICD-10-CM | POA: Diagnosis not present

## 2022-07-16 IMAGING — CT CT CHEST HIGH RESOLUTION
2 of 7 series · 14 of 36 positions shown, 17 images · non-contrast
Comparison: CT abdomen pelvis, 05/29/2020, 05/29/2008

CLINICAL DATA: Interstitial lung disease, increased shortness of
breath with exertion, history of COVID in 3333



[Series 4: high resolution · axial · 0.78mm/px · z∈[-300,-40]mm · 11 of 312 slices shown, 14 images]
[im 26/312  mediastinal]
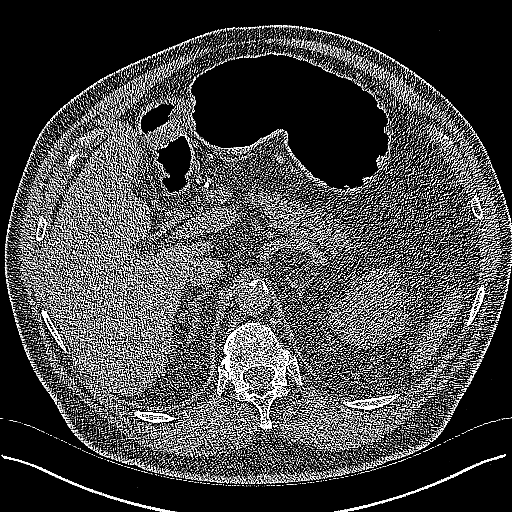
[im 26/312  lung]
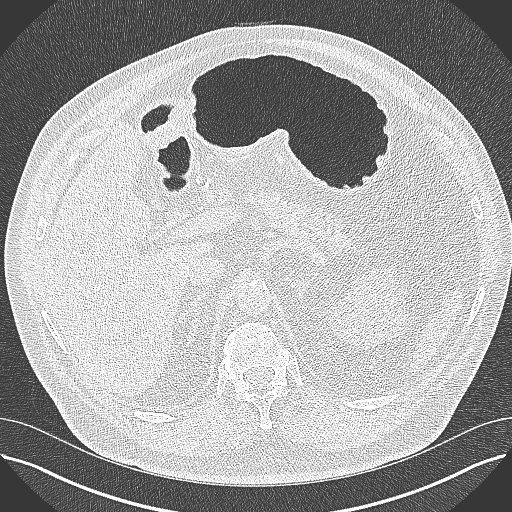
[im 52/312  lung]
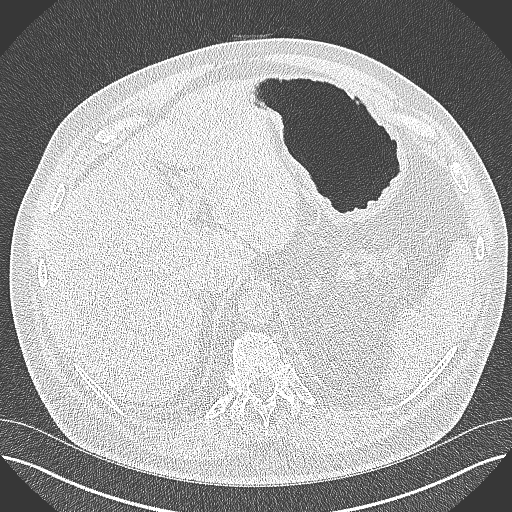
[im 78/312  lung]
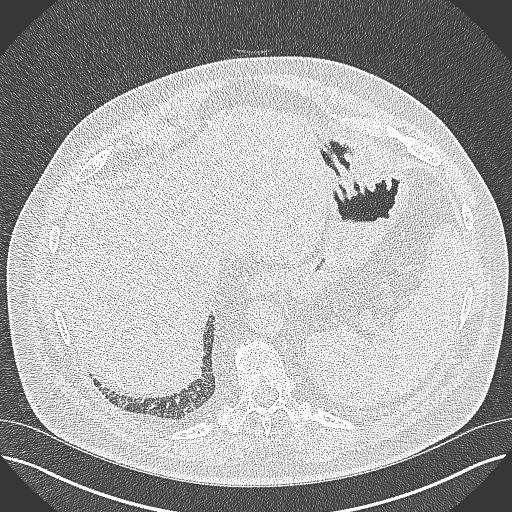
[im 104/312  lung]
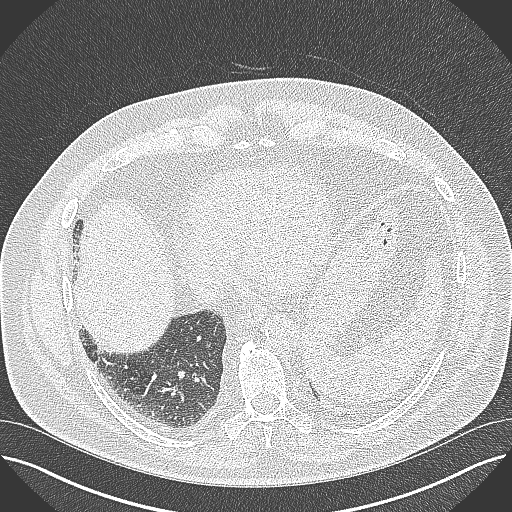
[im 130/312  mediastinal]
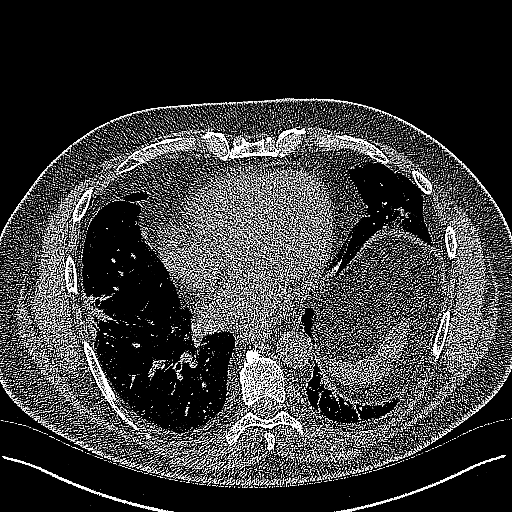
[im 130/312  lung]
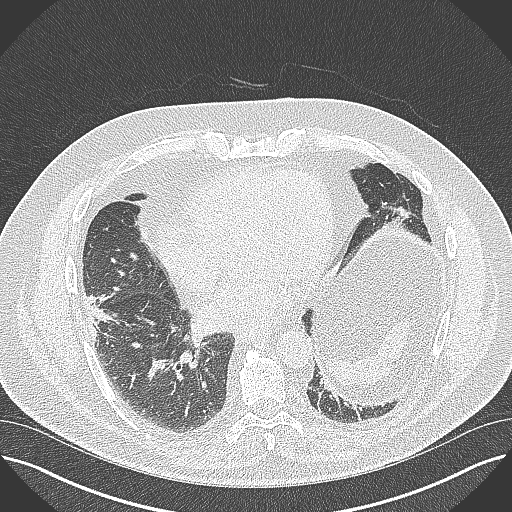
[im 156/312  lung]
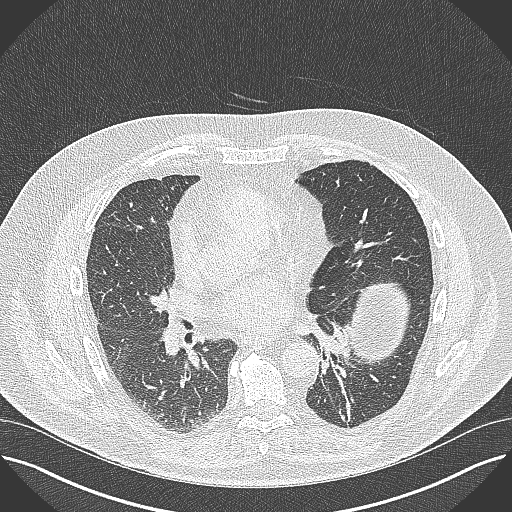
[im 182/312  lung]
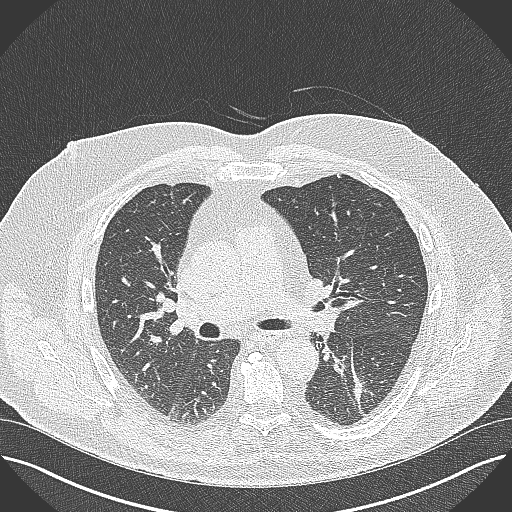
[im 208/312  lung]
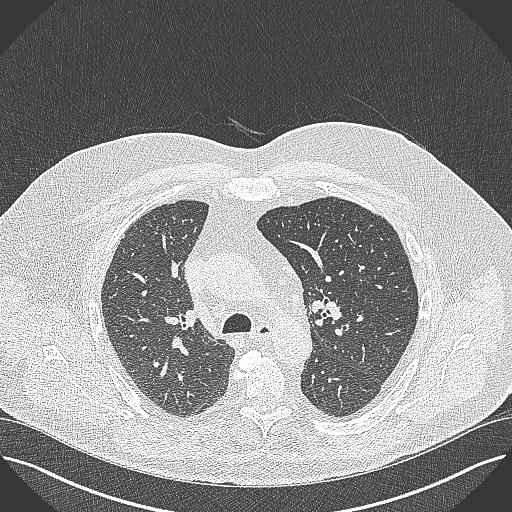
[im 234/312  mediastinal]
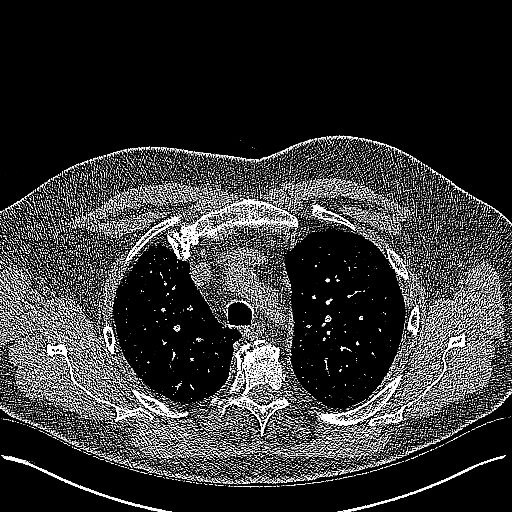
[im 234/312  lung]
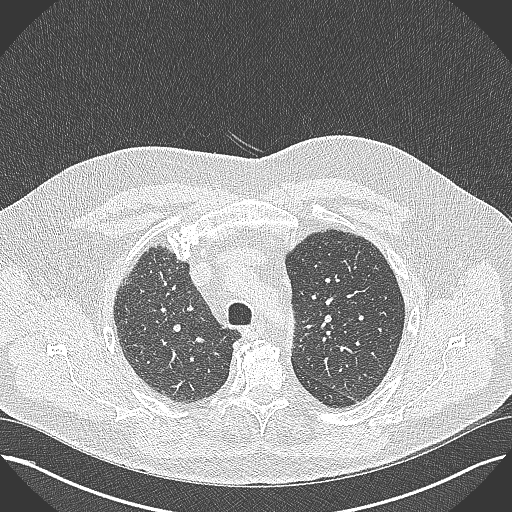
[im 260/312  lung]
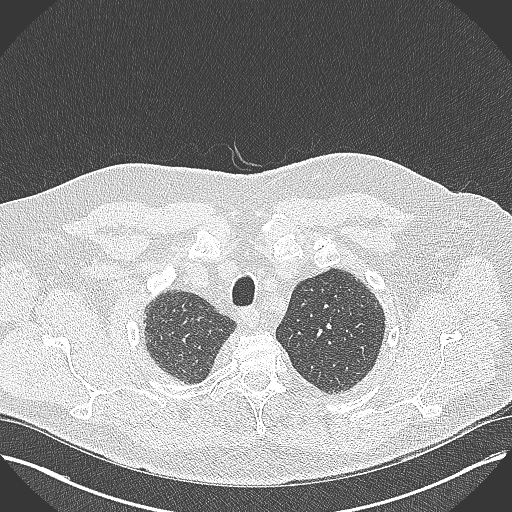
[im 286/312  lung]
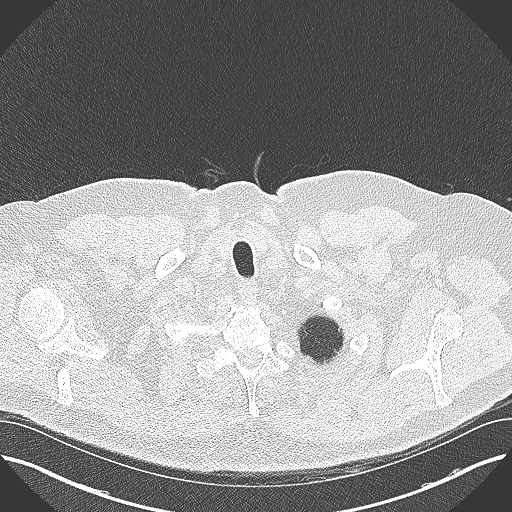

[Series 6: coronal · coronal · 0.59mm/px · 3 of 133 slices shown]
[im 27/133  lung]
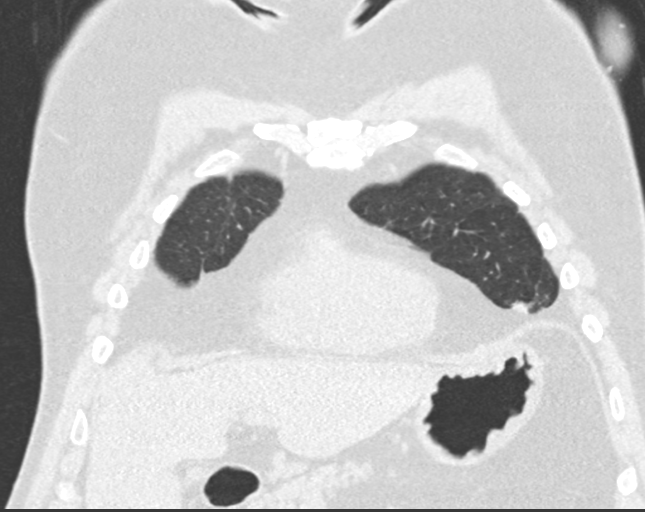
[im 53/133  lung]
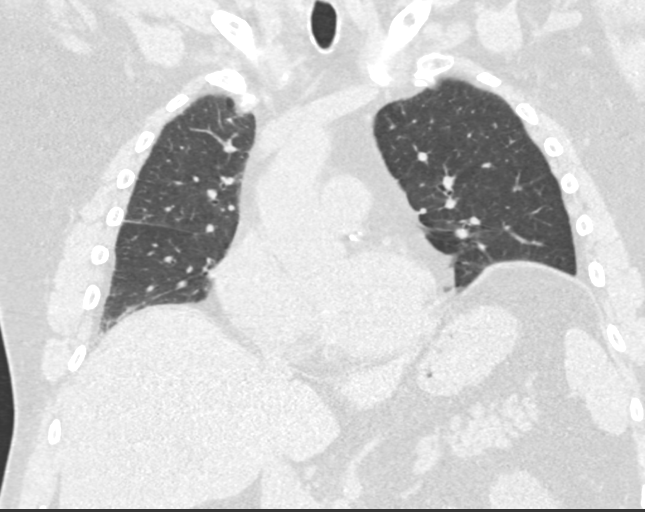
[im 80/133  lung]
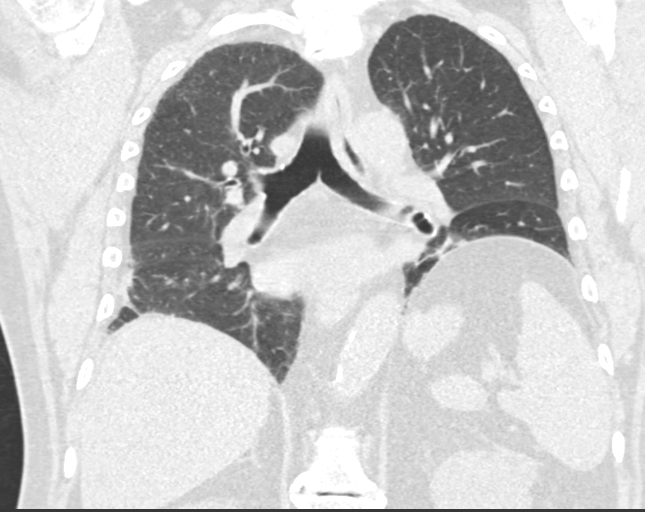

[14 of 36 positions shown; findings below may reference images not displayed]

FINDINGS: Cardiovascular: Aortic atherosclerosis. Normal heart size. Left
coronary artery calcifications. No pericardial effusion.

Mediastinum/Nodes: No enlarged mediastinal, hilar, or axillary lymph
nodes. Thyroid gland, trachea, and esophagus demonstrate no
significant findings.

Lungs/Pleura: Bland appearing, bandlike scarring and volume loss of
the bilateral lung bases with elevation of the left hemidiaphragm,
similar in appearance to both prior examination dated 05/29/2020 and
05/29/2008. No significant air trapping on expiratory phase imaging.
No pleural effusion or pneumothorax.

Upper Abdomen: No acute abnormality. Tiny gallstones and or sludge
contracted in the gallbladder.

Musculoskeletal: No chest wall abnormality. No suspicious osseous
lesions identified.
IMPRESSION: 1. Bland appearing, bandlike scarring and volume loss of the
bilateral lung bases with elevation of the left hemidiaphragm,
consistent with sequelae of prior infection or aspiration and
similar in appearance to the lung bases as included on both prior
examination dated 05/29/2020 and 05/29/2008. No evidence of fibrotic
interstitial lung disease.
2. Coronary artery disease.
3. Cholelithiasis.

Aortic Atherosclerosis ([id number].B).

## 2022-08-15 DIAGNOSIS — H353131 Nonexudative age-related macular degeneration, bilateral, early dry stage: Secondary | ICD-10-CM | POA: Diagnosis not present

## 2022-08-15 DIAGNOSIS — H31001 Unspecified chorioretinal scars, right eye: Secondary | ICD-10-CM | POA: Diagnosis not present

## 2022-08-15 DIAGNOSIS — H52203 Unspecified astigmatism, bilateral: Secondary | ICD-10-CM | POA: Diagnosis not present

## 2022-08-15 DIAGNOSIS — H2513 Age-related nuclear cataract, bilateral: Secondary | ICD-10-CM | POA: Diagnosis not present

## 2022-08-15 DIAGNOSIS — H5213 Myopia, bilateral: Secondary | ICD-10-CM | POA: Diagnosis not present

## 2022-08-19 DIAGNOSIS — G4733 Obstructive sleep apnea (adult) (pediatric): Secondary | ICD-10-CM | POA: Diagnosis not present

## 2022-10-16 ENCOUNTER — Other Ambulatory Visit: Payer: Self-pay | Admitting: Pulmonary Disease

## 2022-10-16 DIAGNOSIS — J4520 Mild intermittent asthma, uncomplicated: Secondary | ICD-10-CM

## 2022-10-18 DIAGNOSIS — E538 Deficiency of other specified B group vitamins: Secondary | ICD-10-CM | POA: Diagnosis not present

## 2022-10-18 DIAGNOSIS — N401 Enlarged prostate with lower urinary tract symptoms: Secondary | ICD-10-CM | POA: Diagnosis not present

## 2022-10-18 DIAGNOSIS — Z79899 Other long term (current) drug therapy: Secondary | ICD-10-CM | POA: Diagnosis not present

## 2022-10-18 DIAGNOSIS — E291 Testicular hypofunction: Secondary | ICD-10-CM | POA: Diagnosis not present

## 2022-10-25 DIAGNOSIS — Z1331 Encounter for screening for depression: Secondary | ICD-10-CM | POA: Diagnosis not present

## 2022-10-25 DIAGNOSIS — Z0000 Encounter for general adult medical examination without abnormal findings: Secondary | ICD-10-CM | POA: Diagnosis not present

## 2022-10-25 DIAGNOSIS — E559 Vitamin D deficiency, unspecified: Secondary | ICD-10-CM | POA: Diagnosis not present

## 2022-10-25 DIAGNOSIS — Z1339 Encounter for screening examination for other mental health and behavioral disorders: Secondary | ICD-10-CM | POA: Diagnosis not present

## 2022-10-25 DIAGNOSIS — Z23 Encounter for immunization: Secondary | ICD-10-CM | POA: Diagnosis not present

## 2022-11-04 DIAGNOSIS — Z23 Encounter for immunization: Secondary | ICD-10-CM | POA: Diagnosis not present

## 2022-11-17 DIAGNOSIS — N401 Enlarged prostate with lower urinary tract symptoms: Secondary | ICD-10-CM | POA: Diagnosis not present

## 2022-11-17 DIAGNOSIS — R350 Frequency of micturition: Secondary | ICD-10-CM | POA: Diagnosis not present

## 2022-11-17 DIAGNOSIS — N5201 Erectile dysfunction due to arterial insufficiency: Secondary | ICD-10-CM | POA: Diagnosis not present

## 2022-11-20 DIAGNOSIS — G4733 Obstructive sleep apnea (adult) (pediatric): Secondary | ICD-10-CM | POA: Diagnosis not present

## 2022-12-07 ENCOUNTER — Ambulatory Visit (INDEPENDENT_AMBULATORY_CARE_PROVIDER_SITE_OTHER): Payer: BC Managed Care – PPO | Admitting: Pulmonary Disease

## 2022-12-07 ENCOUNTER — Encounter: Payer: Self-pay | Admitting: Pulmonary Disease

## 2022-12-07 VITALS — BP 128/70 | HR 64 | Ht 75.0 in | Wt 238.6 lb

## 2022-12-07 DIAGNOSIS — J4520 Mild intermittent asthma, uncomplicated: Secondary | ICD-10-CM | POA: Diagnosis not present

## 2022-12-07 DIAGNOSIS — R942 Abnormal results of pulmonary function studies: Secondary | ICD-10-CM | POA: Diagnosis not present

## 2022-12-07 MED ORDER — BREZTRI AEROSPHERE 160-9-4.8 MCG/ACT IN AERO: 2.0000 | INHALATION_SPRAY | Freq: Two times a day (BID) | RESPIRATORY_TRACT | 6 refills | Status: DC

## 2022-12-07 NOTE — Progress Notes (Signed)
Synopsis: Referred in April 2023 for dyspnea by Alysia Penna, MD  Subjective:   PATIENT ID: Scott Scott [name] DOB: 1955/09/14, MRN: [id number]  HPI  Chief Complaint  Patient presents with   Follow-up    Pt is here for CT F/U visit.   Scott Scott [name] is a 67 year old Scott [name], former smoker with crohn's disease, OSA on CPAP and pernicious anemia who returns to pulmonary clinic for shortness of breath.   He reports significant weight loss of about forty pounds since last September, which he attributes to improved diet and increased physical activity, including personal training and regular visits to the gym. He also mentions that he has been using Breztri, which he believes has helped with his breathing issues. He reports no limitations in his activities due to his breathing and can walk several flights of stairs without needing to stop due to breathlessness. He expresses a desire to continue using Breztri.   OV 10/07/21 He reports improvement with the wheezing and dyspnea since starting breztri inhaler 2 puffs twice daily. He has noticed increased cough with mucous production with since starting the inhaler. He is walking more. He has lost 10lbs since last visit. He is eating better and dating a new woman.   OV 07/07/21 He continues to have exertional dyspnea with exertional wheezing.   Initial OV 05/20/21 Patient had covid 19 infection in April and September 2022 where he was treated with paxlovid both times. He has noticed on going dyspnea with exertion since these infections. He had some dyspnea prior to the infections but since the infections, the dyspnea is much worse. He reports a trip to the mountains where he noted increased dyspnea on hiking and required frequent stops to catch his breath. His heart rate would get up to 150-160 range. He notices dyspnea going up the stairs at home. He does have some wheezing with the dyspnea on exertion. Occasional cough. He has  orthopnea and progressive lower extremity edema over the past 2 years.  He was trialed on symbicort for 2-3 weeks without noticeable improvement in his breathing.   He has history of pulmonary emboli in the 1970s when he had a crohn's flare. He is currently on imuran for crohn's disease. Denies any fevers, chills or sweats. He is using CPAP regularly for OSA and is followed at [facility].  He is a former smoker with 30 pack year history. He quit smoking in August 1980. 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 No history of harmful dust or chemical exposures.    Past Medical History:  Diagnosis Date   Crohn's disease (HCC)    OSA on CPAP 07/31/2013   Pernicious anemia      Family History  Problem Relation Age of Onset   Cancer Mother    Cancer Father    Heart failure Maternal Grandmother    Cancer Maternal Grandfather    Sleep apnea Father    Cancer Paternal Grandmother    Cancer Paternal Grandfather    COPD Brother      Social History   Socioeconomic History   Marital status: Widowed    Spouse name: Shawna Orleans   Number of children: 4   Years of education: 19   Highest education level: Not on file  Occupational History    Comment: Attorney  Tobacco Use   Smoking status: Former    Current packs/day: 0.00    Types: Cigarettes    Quit date: 09/16/1978  Years since quitting: 44.2   Smokeless tobacco: Never  Substance and Sexual Activity   Alcohol use: Not Currently    Alcohol/week: 4.0 standard drinks of alcohol    Types: 4 Glasses of wine per week   Drug use: No   Sexual activity: Not on file  Other Topics Concern   Not on file  Social History Narrative   Patient consumes 1-2 cups caffeine daily   Social Determinants of Health   Financial Resource Strain: Not on file  Food Insecurity: Not on file  Transportation Needs: Not on file  Physical Activity: Not on file  Stress: Not on file  Social Connections: Not on file  Intimate  Partner Violence: Not on file     No Known Allergies   Outpatient Medications Prior to Visit  Medication Sig Dispense Refill   azaTHIOprine (IMURAN) 50 MG tablet Take 200 mg by mouth daily.     CALCIUM PO Take 600 mg by mouth 2 (two) times daily.      Cholecalciferol (VITAMIN D3) 2000 UNITS TABS Take 2 tablets by mouth daily. Twice daily     colesevelam (WELCHOL) 625 MG tablet Take 1,875 mg by mouth 2 (two) times daily with a meal.      Cyanocobalamin (VITAMIN B 12) 100 MCG LOZG Place 1 tablet under the tongue daily.     Loperamide HCl (IMODIUM PO) Take 4 tablets by mouth 4 (four) times daily.     Multiple Vitamin (MULTIVITAMIN) capsule Take 1 capsule by mouth daily.     omeprazole (PRILOSEC) 20 MG capsule Take 20 mg by mouth daily.     POTASSIUM PO Take 1 tablet by mouth daily.      Probiotic Product (ALIGN) 4 MG CAPS Take 1 capsule by mouth daily.     solifenacin (VESICARE) 5 MG tablet      tadalafil (CIALIS) 5 MG tablet Take 5 mg by mouth daily.     Budeson-Glycopyrrol-Formoterol (BREZTRI AEROSPHERE) 160-9-4.8 MCG/ACT AERO Inhale 2 puffs into the lungs in the morning and at bedtime. 32.1 g 0   No facility-administered medications prior to visit.   Review of Systems  Constitutional:  Negative for chills, fever, malaise/fatigue and weight loss.  HENT:  Negative for congestion, sinus pain and sore throat.   Eyes: Negative.   Respiratory:  Negative for cough, hemoptysis, sputum production, shortness of breath and wheezing.   Cardiovascular:  Negative for chest pain, palpitations, orthopnea, claudication and leg swelling.  Gastrointestinal:  Negative for abdominal pain, heartburn, nausea and vomiting.  Genitourinary: Negative.   Musculoskeletal:  Negative for joint pain and myalgias.  Skin:  Negative for rash.  Neurological:  Negative for weakness.  Endo/Heme/Allergies: Negative.   Psychiatric/Behavioral: Negative.      Objective:   Vitals:   12/07/22 1006  BP: 128/70  Pulse:  64  SpO2: 97%  Weight: 238 lb 9.6 oz (108.2 kg)  Height: 6\' 3"  (1.905 m)    Physical Exam Constitutional:      General: He is not in acute distress.    Appearance: He is obese.  HENT:     Head: Normocephalic and atraumatic.  Eyes:     Conjunctiva/sclera: Conjunctivae normal.  Cardiovascular:     Rate and Rhythm: Normal rate and regular rhythm.     Pulses: Normal pulses.     Heart sounds: Normal heart sounds. No murmur heard. Musculoskeletal:     Right lower leg: No edema.     Left lower leg: No edema.  Skin:  General: Skin is warm and dry.  Neurological:     General: No focal deficit present.     Mental Status: He is alert.    CBC    Component Value Date/Time   WBC 5.0 05/24/2008 0448   RBC 3.53 (L) 05/24/2008 0448   HGB 12.5 (L) 05/24/2008 0448   HCT 35.2 (L) 05/24/2008 0448   PLT 151 05/24/2008 0448   MCV 99.6 05/24/2008 0448   MCHC 35.5 05/24/2008 0448   RDW 14.5 05/24/2008 0448   LYMPHSABS 0.5 (L) 05/22/2008 1555   MONOABS 0.6 05/22/2008 1555   EOSABS 0.0 05/22/2008 1555   BASOSABS 0.0 05/22/2008 1555      Latest Ref Rng & Units 05/29/2020    4:43 PM 05/24/2008    4:48 AM 05/23/2008    4:03 AM  BMP  Glucose 70 - 99 mg/dL  119  147   BUN 6 - 23 mg/dL  6  9   Creatinine 8.29 - 1.24 mg/dL 5.62  1.30  8.65   Sodium 135 - 145 mEq/L  136  136   Potassium 3.5 - 5.1 mEq/L  3.4  3.7   Chloride 96 - 112 mEq/L  105  104   CO2 19 - 32 mEq/L  26  27   Calcium 8.4 - 10.5 mg/dL  8.0  8.0    Chest imaging: HRCT Chest 07/11/22 1. No definitive imaging findings to suggest interstitial lung disease. 2. Chronic elevation of left hemidiaphragm and scattered areas of chronic appearing postinfectious or inflammatory scarring, similar to prior examinations. 3. Aortic atherosclerosis, in addition to two-vessel coronary artery disease. Please note that although the presence of coronary artery calcium documents the presence of coronary artery disease, the severity of this  disease and any potential stenosis cannot be assessed on this non-gated CT examination. Assessment for potential risk factor modification, dietary therapy or pharmacologic therapy may be warranted, if clinically indicated. 4. There are calcifications of the aortic valve and mitral annulus. Echocardiographic correlation for evaluation of potential valvular dysfunction may be warranted if clinically indicated. 5. Cholelithiasis.  HRCT Chest 06/01/21 1. Bland appearing, bandlike scarring and volume loss of the bilateral lung bases with elevation of the left hemidiaphragm, consistent with sequelae of prior infection or aspiration and similar in appearance to the lung bases as included on both prior examination dated 05/29/2020 and 05/29/2008. No evidence of fibrotic interstitial lung disease. 2. Coronary artery disease. 3. Cholelithiasis.  CT Abdomen 05/29/20 Lower chest: Bibasilar linear consolidation consistent with scarring or atelectasis. No acute pleural or parenchymal lung disease.  PFT:    Latest Ref Rng & Units 07/07/2021    3:06 PM  PFT Results  FVC-Pre L 2.53   FVC-Predicted Pre % 45   FVC-Post L 2.39   FVC-Predicted Post % 42   Pre FEV1/FVC % % 80   Post FEV1/FCV % % 83   FEV1-Pre L 2.03   FEV1-Predicted Pre % 48   FEV1-Post L 1.97   DLCO uncorrected ml/min/mmHg 24.93   DLCO UNC% % 79   DLCO corrected ml/min/mmHg 24.93   DLCO COR %Predicted % 79   DLVA Predicted % 125   TLC L 5.09   TLC % Predicted % 61   RV % Predicted % 82   PFT 2023: Mild restriction  Labs:  Path:  Echo 06/16/21: LVEF 60-65%. LV diastolic parameters are normal. RV systolic function is normal. RV size is normal.   Heart Catheterization:  Assessment & Plan:   Mild  intermittent reactive airway disease without complication - Plan: Budeson-Glycopyrrol-Formoterol (BREZTRI AEROSPHERE) 160-9-4.8 MCG/ACT AERO  Restrictive ventilatory defect  Discussion: Scott Scott [name] is a 67 year old  Scott [name], former smoker with crohn's disease, OSA on CPAP and pernicious anemia who returns to pulmonary clinic for shortness of breath and reactive airways disease.   The etiology of his dyspnea is likely related to his elevated left hemidiaphragm and weight.  Lung Condition No further scarring or changes noted on recent CT scan. No concern for progressive inflammatory or interstitial lung disease. -No further action required at this time. Patient to contact office if any changes in breathing occur.  over the past year and a half which is displayed by the mild restrictive defect on PFTs. No evidence of interstitial lung disease. Mild basilar scarring has been present since 2010 upon review of abdominal imaging. His echo is unremarkable.   Weight Loss Significant weight loss of 40 pounds since last September. Improved physical activity and dietary habits. No mention of any associated symptoms or concerns. -Continue current lifestyle modifications.  Shortness of Breath/Reactive Airways disease No current limitations in activities due to breathing. Improvement noted with weight loss and increased physical activity. Patient wishes to continue Breztri. -Continue Breztri as needed. -Schedule annual visits for prescription renewal.  Follow up in 1 year  Melody Comas, MD [facility] Office: [phone]   Current Outpatient Medications:    azaTHIOprine (IMURAN) 50 MG tablet, Take 200 mg by mouth daily., Disp: , Rfl:    CALCIUM PO, Take 600 mg by mouth 2 (two) times daily. , Disp: , Rfl:    Cholecalciferol (VITAMIN D3) 2000 UNITS TABS, Take 2 tablets by mouth daily. Twice daily, Disp: , Rfl:    colesevelam (WELCHOL) 625 MG tablet, Take 1,875 mg by mouth 2 (two) times daily with a meal. , Disp: , Rfl:    Cyanocobalamin (VITAMIN B 12) 100 MCG LOZG, Place 1 tablet under the tongue daily., Disp: , Rfl:    Loperamide HCl (IMODIUM PO), Take 4 tablets by mouth 4 (four) times  daily., Disp: , Rfl:    Multiple Vitamin (MULTIVITAMIN) capsule, Take 1 capsule by mouth daily., Disp: , Rfl:    omeprazole (PRILOSEC) 20 MG capsule, Take 20 mg by mouth daily., Disp: , Rfl:    POTASSIUM PO, Take 1 tablet by mouth daily. , Disp: , Rfl:    Probiotic Product (ALIGN) 4 MG CAPS, Take 1 capsule by mouth daily., Disp: , Rfl:    solifenacin (VESICARE) 5 MG tablet, , Disp: , Rfl:    tadalafil (CIALIS) 5 MG tablet, Take 5 mg by mouth daily., Disp: , Rfl:    Budeson-Glycopyrrol-Formoterol (BREZTRI AEROSPHERE) 160-9-4.8 MCG/ACT AERO, Inhale 2 puffs into the lungs in the morning and at bedtime., Disp: 32.1 g, Rfl: 6

## 2022-12-07 NOTE — Patient Instructions (Addendum)
Your CT Chest scan is stable  Continue bretriz inhaler 2 puffs twice daily  Follow up in 1 year, call sooner if needed

## 2022-12-14 ENCOUNTER — Other Ambulatory Visit (HOSPITAL_COMMUNITY): Payer: Self-pay

## 2022-12-15 DIAGNOSIS — D044 Carcinoma in situ of skin of scalp and neck: Secondary | ICD-10-CM | POA: Diagnosis not present

## 2022-12-15 DIAGNOSIS — L570 Actinic keratosis: Secondary | ICD-10-CM | POA: Diagnosis not present

## 2022-12-15 DIAGNOSIS — Z85828 Personal history of other malignant neoplasm of skin: Secondary | ICD-10-CM | POA: Diagnosis not present

## 2022-12-15 DIAGNOSIS — L821 Other seborrheic keratosis: Secondary | ICD-10-CM | POA: Diagnosis not present

## 2023-02-16 DIAGNOSIS — C44722 Squamous cell carcinoma of skin of right lower limb, including hip: Secondary | ICD-10-CM | POA: Diagnosis not present

## 2023-02-16 DIAGNOSIS — C44629 Squamous cell carcinoma of skin of left upper limb, including shoulder: Secondary | ICD-10-CM | POA: Diagnosis not present

## 2023-02-21 DIAGNOSIS — G4733 Obstructive sleep apnea (adult) (pediatric): Secondary | ICD-10-CM | POA: Diagnosis not present

## 2023-03-31 DIAGNOSIS — K5080 Crohn's disease of both small and large intestine without complications: Secondary | ICD-10-CM | POA: Diagnosis not present

## 2023-05-11 DIAGNOSIS — R350 Frequency of micturition: Secondary | ICD-10-CM | POA: Diagnosis not present

## 2023-05-11 DIAGNOSIS — N401 Enlarged prostate with lower urinary tract symptoms: Secondary | ICD-10-CM | POA: Diagnosis not present

## 2023-05-22 DIAGNOSIS — K5080 Crohn's disease of both small and large intestine without complications: Secondary | ICD-10-CM | POA: Diagnosis not present

## 2023-05-22 DIAGNOSIS — G4733 Obstructive sleep apnea (adult) (pediatric): Secondary | ICD-10-CM | POA: Diagnosis not present

## 2023-05-23 DIAGNOSIS — N401 Enlarged prostate with lower urinary tract symptoms: Secondary | ICD-10-CM | POA: Diagnosis not present

## 2023-05-23 DIAGNOSIS — N5201 Erectile dysfunction due to arterial insufficiency: Secondary | ICD-10-CM | POA: Diagnosis not present

## 2023-05-23 DIAGNOSIS — R350 Frequency of micturition: Secondary | ICD-10-CM | POA: Diagnosis not present

## 2023-06-14 ENCOUNTER — Telehealth: Payer: Self-pay

## 2023-06-14 DIAGNOSIS — Z85828 Personal history of other malignant neoplasm of skin: Secondary | ICD-10-CM | POA: Diagnosis not present

## 2023-06-14 DIAGNOSIS — L820 Inflamed seborrheic keratosis: Secondary | ICD-10-CM | POA: Diagnosis not present

## 2023-06-14 DIAGNOSIS — D0439 Carcinoma in situ of skin of other parts of face: Secondary | ICD-10-CM | POA: Diagnosis not present

## 2023-06-14 DIAGNOSIS — L570 Actinic keratosis: Secondary | ICD-10-CM | POA: Diagnosis not present

## 2023-06-14 DIAGNOSIS — C4442 Squamous cell carcinoma of skin of scalp and neck: Secondary | ICD-10-CM | POA: Diagnosis not present

## 2023-06-14 DIAGNOSIS — L905 Scar conditions and fibrosis of skin: Secondary | ICD-10-CM | POA: Diagnosis not present

## 2023-06-14 DIAGNOSIS — D0461 Carcinoma in situ of skin of right upper limb, including shoulder: Secondary | ICD-10-CM | POA: Diagnosis not present

## 2023-06-14 NOTE — Telephone Encounter (Signed)
 Called pt and asked him to bring his CPAP Machine with him to his appointment on tomorrow.

## 2023-06-14 NOTE — Progress Notes (Unsigned)
 Scott [name]

## 2023-06-15 ENCOUNTER — Encounter: Payer: Self-pay | Admitting: Adult Health

## 2023-06-15 ENCOUNTER — Ambulatory Visit: Payer: BC Managed Care – PPO | Admitting: Adult Health

## 2023-06-15 VITALS — BP 122/63 | HR 72 | Ht 75.0 in | Wt 236.6 lb

## 2023-06-15 DIAGNOSIS — G4733 Obstructive sleep apnea (adult) (pediatric): Secondary | ICD-10-CM

## 2023-06-15 NOTE — Progress Notes (Signed)
 PATIENT: Scott [name] DOB: 17-Aug-1955  REASON FOR VISIT: follow up HISTORY FROM: patient  Chief Complaint  Patient presents with   Room 5    Pt is here Alone. Pt states that everything has been going well with his CPAP Machine. Pt denies any new questions or concerns to discuss today about his CPAP Machine.      HISTORY OF PRESENT ILLNESS: Today 06/15/23:  Scott [name] is a 68 y.o. male with a history of OSA on CPAP. Returns today for follow-up. No concerns with CPAP. Reports that its working well. Continues to find it beneficial. Changes out his supplies regularly. Returns today for follow-up.         06/15/22: Scott [name] is a 68 y.o. male with a history of OSA on CPAP. Returns today for follow-up.  Reports that CPAP is working well.  He does change out his supplies regularly.  He does note that the travel CPAP dries him out.  His download is below     06/09/21: Scott [name] is a 68 year old male with a history of OSA on CPAP. CPAP is working well. Denies any new issues.     06/09/20:  Scott [name] is a 68 year old male with a history of obstructive sleep apnea on CPAP.  He reports that the CPAP continues to work well for him.  He states that there are some nights he does not use it greater than 4 hours because he does not sleep more than 4 hours.  He lost his wife last June.  States that he is very lonely without her-understandably so they were married 40 years.  He joins me today for follow-up.   05/30/19: Scott [name] is a 68 year old male with a history of obstructive sleep apnea on CPAP.  His download indicates that he use his machine nightly for compliance of 100%.  He uses machine greater than 4 hours 20 out of 30 days for compliance of 67%.  Her residual AHI is 1 on 8 cm of water with EPR 3.  Leak in the 95th percentile is 12.8 L/min.  He reports that the CPAP is working well for him.  Reports that he tends to stay up too late and  therefore he does not use a CPAP for very long some nights.   HISTORY (copied from Dr. Dail Drought note) 02/28/17: I reviewed his CPAP compliance data from 01/28/2017 through 02/26/2017 which is a total of 30 days, during which time he used his home CPAP 18 days but also his travel CPAP. On the home CPAP his average usage was 5 hours and 47 minutes, residual AHI at goal at 1.1 per hour, leak acceptable with the 95th percentile at 9.3 L/m on a pressure of 8 cm with EPR of 3. A download is not possible from his travel CPAP. He is not sure if it is that at the same pressure level as his home CPAP. Of note, I provided him with a travel CPAP prescription last year in December, it was supposed to be set at the same pressure level as his regular CPAP. He is advised to double check with his DME company. He reports doing well, compliant with CPAP, no recent illness, no major changes to medical Hx of medications, no longer on testosterone . Crohn's stable. Tries to hydrate well with water.   REVIEW OF SYSTEMS: Out of a complete 14 system review of symptoms, the patient complains only of the following symptoms, and all other reviewed systems  are negative.  See FYI  ALLERGIES: No Known Allergies  HOME MEDICATIONS: Outpatient Medications Prior to Visit  Medication Sig Dispense Refill   azaTHIOprine (IMURAN) 50 MG tablet Take 200 mg by mouth daily.     Budeson-Glycopyrrol-Formoterol (BREZTRI  AEROSPHERE) 160-9-4.8 MCG/ACT AERO Inhale 2 puffs into the lungs in the morning and at bedtime. 32.1 g 6   CALCIUM PO Take 600 mg by mouth 2 (two) times daily.      Cholecalciferol (VITAMIN D3) 2000 UNITS TABS Take 2 tablets by mouth daily. Twice daily     colesevelam (WELCHOL) 625 MG tablet Take 1,875 mg by mouth 2 (two) times daily with a meal.      Cyanocobalamin  (VITAMIN B 12) 100 MCG LOZG Place 1 tablet under the tongue daily.     Loperamide HCl (IMODIUM PO) Take 4 tablets by mouth 4 (four) times daily.     Multiple  Vitamin (MULTIVITAMIN) capsule Take 1 capsule by mouth daily.     omeprazole (PRILOSEC) 20 MG capsule Take 20 mg by mouth daily.     POTASSIUM PO Take 1 tablet by mouth daily.      Probiotic Product (ALIGN) 4 MG CAPS Take 1 capsule by mouth daily.     solifenacin (VESICARE) 5 MG tablet      tadalafil (CIALIS) 5 MG tablet Take 5 mg by mouth daily.     No facility-administered medications prior to visit.    PAST MEDICAL HISTORY: Past Medical History:  Diagnosis Date   Crohn's disease (HCC)    OSA on CPAP 07/31/2013   Pernicious anemia     PAST SURGICAL HISTORY: Past Surgical History:  Procedure Laterality Date   BOWEL RESECTION     2    FAMILY HISTORY: Family History  Problem Relation Age of Onset   Cancer Mother    Cancer Father    Heart failure Maternal Grandmother    Cancer Maternal Grandfather    Sleep apnea Father    Cancer Paternal Grandmother    Cancer Paternal Grandfather    COPD Brother     SOCIAL HISTORY: Social History   Socioeconomic History   Marital status: Widowed    Spouse name: Prentice Brochure   Number of children: 4   Years of education: 19   Highest education level: Not on file  Occupational History    Comment: Attorney  Tobacco Use   Smoking status: Former    Current packs/day: 0.00    Types: Cigarettes    Quit date: 09/16/1978    Years since quitting: 44.7   Smokeless tobacco: Never  Substance and Sexual Activity   Alcohol  use: Not Currently    Alcohol /week: 4.0 standard drinks of alcohol     Types: 4 Glasses of wine per week   Drug use: No   Sexual activity: Not on file  Other Topics Concern   Not on file  Social History Narrative   Patient consumes 1-2 cups caffeine daily   Social Drivers of Corporate investment banker Strain: Not on file  Food Insecurity: Not on file  Transportation Needs: Not on file  Physical Activity: Not on file  Stress: Not on file  Social Connections: Not on file  Intimate Partner Violence: Not on file       PHYSICAL EXAM  Vitals:   06/15/23 0907  BP: 122/63  Pulse: 72  Weight: 236 lb 9.6 oz (107.3 kg)  Height: 6\' 3"  (1.905 m)    Body mass index is 29.57 kg/m.  Generalized: Well  developed, in no acute distress  Chest: Lungs clear to auscultation bilaterally  Neurological examination  Mentation: Alert oriented to time, place, history taking. Follows all commands speech and language fluent Cranial nerve II-XII: Facial symmetry noted  DIAGNOSTIC DATA (LABS, IMAGING, TESTING) - I reviewed patient records, labs, notes, testing and imaging myself where available.  Lab Results  Component Value Date   WBC 5.0 05/24/2008   HGB 12.5 (L) 05/24/2008   HCT 35.2 (L) 05/24/2008   MCV 99.6 05/24/2008   PLT 151 05/24/2008      Component Value Date/Time   NA 136 05/24/2008 0448   K 3.4 (L) 05/24/2008 0448   CL 105 05/24/2008 0448   CO2 26 05/24/2008 0448   GLUCOSE 108 (H) 05/24/2008 0448   BUN 6 05/24/2008 0448   CREATININE 0.80 05/29/2020 1643   CALCIUM 8.0 (L) 05/24/2008 0448   PROT 6.1 05/22/2008 1555   ALBUMIN 3.4 (L) 05/22/2008 1555   AST 26 05/22/2008 1555   ALT 18 05/22/2008 1555   ALKPHOS 75 05/22/2008 1555   BILITOT 1.0 05/22/2008 1555   GFRNONAA >60 05/24/2008 0448   GFRAA  05/24/2008 0448    >60        The eGFR has been calculated using the MDRD equation. This calculation has not been validated in all clinical situations. eGFR's persistently <60 mL/min signify possible Chronic Kidney Disease.      ASSESSMENT AND PLAN 68 y.o. year old male  has a past medical history of Crohn's disease (HCC), OSA on CPAP (07/31/2013), and Pernicious anemia. here with:  OSA on CPAP  - CPAP compliance- good - Good treatment of AHI  - Encourage patient to use CPAP nightly and > 4 hours each night - F/U in 1 year or sooner if needed   Clem Currier, MSN, NP-C 06/15/2023, 9:09 AM [facility] [address] [place], [place] [address] [phone]

## 2023-06-15 NOTE — Patient Instructions (Signed)
 Continue using CPAP nightly and greater than 4 hours each night If your symptoms worsen or you develop new symptoms please let us know.

## 2023-08-11 ENCOUNTER — Telehealth: Payer: Self-pay | Admitting: Adult Health

## 2023-08-11 ENCOUNTER — Encounter: Payer: Self-pay | Admitting: Adult Health

## 2023-08-11 DIAGNOSIS — G4733 Obstructive sleep apnea (adult) (pediatric): Secondary | ICD-10-CM

## 2023-08-11 NOTE — Telephone Encounter (Signed)
 Pt has spoken with DME about message on CPAP that it needs replacing , DME states they need orders from MD

## 2023-08-14 NOTE — Telephone Encounter (Signed)
 Can we call patient and let him know that he will need to repeat a home sleep test first.  As long as his machine is still actively working.  If he is amenable to that we can order and then afterwards order a new machine

## 2023-08-15 NOTE — Telephone Encounter (Signed)
 I called pt and let him know that Scott [name]/NP recommended to do HST to make sure no changes needed in settings of pap therapy.  Will order the hst and then will order new machine pending results of HST.  Pt verbalized understanding. Ordered.

## 2023-08-16 DIAGNOSIS — H2513 Age-related nuclear cataract, bilateral: Secondary | ICD-10-CM | POA: Diagnosis not present

## 2023-08-16 DIAGNOSIS — H353131 Nonexudative age-related macular degeneration, bilateral, early dry stage: Secondary | ICD-10-CM | POA: Diagnosis not present

## 2023-08-16 DIAGNOSIS — H5213 Myopia, bilateral: Secondary | ICD-10-CM | POA: Diagnosis not present

## 2023-08-18 ENCOUNTER — Ambulatory Visit (INDEPENDENT_AMBULATORY_CARE_PROVIDER_SITE_OTHER): Admitting: Neurology

## 2023-08-18 DIAGNOSIS — G4733 Obstructive sleep apnea (adult) (pediatric): Secondary | ICD-10-CM

## 2023-08-18 DIAGNOSIS — G4734 Idiopathic sleep related nonobstructive alveolar hypoventilation: Secondary | ICD-10-CM

## 2023-09-14 NOTE — Progress Notes (Signed)
 See procedure note.

## 2023-09-15 NOTE — Procedures (Signed)
 [facility]  HOME SLEEP TEST (SANSA) REPORT (Mail-Out Device):   STUDY DATE: 08/27/2023  DOB: [birth year]  MRN: [id number]  ORDERING CLINICIAN: True Mar, MD, PhD   REFERRING CLINICIAN: Duwaine Russell, NP  CLINICAL INFORMATION/HISTORY: 68 year old male with an underlying medical history of hyperlipidemia, PE secondary to Crohn's disease (on Imuran), s/p multiple colon resection surgeries, chronic anemia, rosacea, vitamin D deficiency, sleep apnea on PAP therapy, low testosterone  and overweight state who presents for reevaluation of his OSA.  He has been compliant with his CPAP of 8 cm with EPR of 3 with good tolerance and good apnea control.  He should be eligible for a new machine.  BMI (at the time of sleep clinic visit and/or test date): 29.6 kg/m  FINDINGS:   Study Protocol:    The SANSA single-point-of-skin-contact chest-worn sensor - an FDA cleared and DOT approved type 4 home sleep test device - measures eight physiological channels,  including blood oxygen saturation (measured via PPG [photoplethysmography]), EKG-derived heart rate, respiratory effort, chest movement (measured via accelerometer), snoring, body position, and actigraphy. The device is designed to be worn for up to 10 hours per study.   Sleep Summary:   Total Recording Time (hours, min): 7 hours, 40 min  Total Effective Sleep Time (hours, min):  5 hours, 37 min  Sleep Efficiency (%):    77%   Respiratory Indices:   Calculated sAHI (per hour):  31.3/hour         Oxygen Saturation Statistics:    Oxygen Saturation (%) Mean: 93.9%   Minimum oxygen saturation (%):                 66.9%   O2 Saturation Range (%): 66.9-99.9%   Time below or at 88% saturation: 21 min   Pulse Rate Statistics:   Pulse Mean (bpm):    58/min    Pulse Range (45-99/min)   Snoring: Mild to louder  IMPRESSION/DIAGNOSES:   OSA (obstructive sleep apnea), severe Nocturnal Hypoxemia  RECOMMENDATIONS:    This home sleep test demonstrates severe obstructive sleep apnea with a total AHI of 31.3/hour and O2 nadir of 66.9% with significant time below or at 88% saturation of over 20 minutes for the study, indicating nocturnal hypoxemia. Snoring was detected, in the mild to loud range.  Ongoing treatment with positive airway pressure is highly recommended. The patient has been compliant with his CPAP of 8 cm with good apnea control and good tolerance.  He should be eligible for a new machine.  I recommend keeping his settings the same, mask of choice, sized to fit.  An overnight pulse oximetry test while on CPAP therapy may give an assessment of how good his oxygen saturations are overnight while on treatment with PAP therapy.  A laboratory attended titration study can be considered in the future for optimization of treatment settings and to improve tolerance and compliance, if needed, down the road. Alternative treatment options are limited secondary to the severity of the patient's sleep disordered breathing, but may include surgical treatment with an implantable hypoglossal nerve stimulator (in carefully selected candidates, meeting criteria).  Concomitant weight loss is recommended (where clinically appropriate). Please note, that untreated obstructive sleep apnea may carry additional perioperative morbidity. Patients with significant obstructive sleep apnea should receive perioperative PAP therapy and the surgeons and particularly the anesthesiologist should be informed of the diagnosis and the severity of the sleep disordered breathing. The patient should be cautioned not to drive, work at heights, or operate dangerous  or heavy equipment when tired or sleepy. Review and reiteration of good sleep hygiene measures should be pursued with any patient. Other causes of the patient's symptoms, including circadian rhythm disturbances, an underlying mood disorder, medication effect and/or an underlying medical problem  cannot be ruled out based on this test. Clinical correlation is recommended.  The patient and his referring provider will be notified of the test results. The patient will be seen in follow up in sleep clinic at [facility].  I certify that I have reviewed the raw data recording prior to the issuance of this report in accordance with the standards of the American Academy of Sleep Medicine (AASM).    INTERPRETING PHYSICIAN:   True Mar, MD, PhD Medical Director, [facility]) Diplomat, ABPN (Neurology and Sleep)   [facility] [address] [place], [place] [address] [phone]

## 2023-09-18 ENCOUNTER — Ambulatory Visit: Payer: Self-pay | Admitting: Adult Health

## 2023-09-18 DIAGNOSIS — G4733 Obstructive sleep apnea (adult) (pediatric): Secondary | ICD-10-CM

## 2023-09-18 DIAGNOSIS — L570 Actinic keratosis: Secondary | ICD-10-CM | POA: Diagnosis not present

## 2023-09-18 DIAGNOSIS — D0471 Carcinoma in situ of skin of right lower limb, including hip: Secondary | ICD-10-CM | POA: Diagnosis not present

## 2023-09-18 DIAGNOSIS — C44629 Squamous cell carcinoma of skin of left upper limb, including shoulder: Secondary | ICD-10-CM | POA: Diagnosis not present

## 2023-09-18 DIAGNOSIS — L821 Other seborrheic keratosis: Secondary | ICD-10-CM | POA: Diagnosis not present

## 2023-09-18 DIAGNOSIS — B078 Other viral warts: Secondary | ICD-10-CM | POA: Diagnosis not present

## 2023-09-18 DIAGNOSIS — L905 Scar conditions and fibrosis of skin: Secondary | ICD-10-CM | POA: Diagnosis not present

## 2023-09-18 DIAGNOSIS — Z85828 Personal history of other malignant neoplasm of skin: Secondary | ICD-10-CM | POA: Diagnosis not present

## 2023-09-18 DIAGNOSIS — C4442 Squamous cell carcinoma of skin of scalp and neck: Secondary | ICD-10-CM | POA: Diagnosis not present

## 2023-09-20 NOTE — Telephone Encounter (Signed)
 Patient returned my call. We discussed his sleep study results and orders as noted below. Patient agrees to setup on new machine and have ONO while on cpap. Patient would like to continue using [employer] as DME. He will notify us  or [employer] if he isn't called within 1 week. He is aware of compliance requirements including usage and follow-up. He was scheduled for a 30-90 day visit on 11/11 at 11:30 am arrival 11:15. His questions were answered. Patient thanked me for the call.   Orders sent to [employer].

## 2023-09-20 NOTE — Telephone Encounter (Signed)
-----   Message from [facility] sent at 09/18/2023  2:52 PM EDT ----- Please call patient and let him know that he has severe obstructive sleep apnea per his last home sleep test.  It also showed that his oxygen desaturation was low during the study.  We can send in an  order for new machine CPAP ResMed pressure of 8 cm.  Once he gets the new machine we need to do a overnight pulse oximetry.  I have placed orders for this ----- Message ----- From: Buck Saucer, MD Sent: 09/15/2023   1:20 PM EDT To: Duwaine Russell, NP

## 2023-09-20 NOTE — Telephone Encounter (Signed)
 Scott [name], Heather CROME, RN; [place], [place]; [place], [place] pulled o2/ono order.   Thank you!  Scott [name]

## 2023-09-20 NOTE — Telephone Encounter (Signed)
 Called pt and LVM asking for call back to discuss his sleep study results and next steps.

## 2023-09-21 DIAGNOSIS — K5080 Crohn's disease of both small and large intestine without complications: Secondary | ICD-10-CM | POA: Diagnosis not present

## 2023-09-21 DIAGNOSIS — D649 Anemia, unspecified: Secondary | ICD-10-CM | POA: Diagnosis not present

## 2023-09-28 DIAGNOSIS — G4733 Obstructive sleep apnea (adult) (pediatric): Secondary | ICD-10-CM | POA: Diagnosis not present

## 2023-10-05 DIAGNOSIS — D72819 Decreased white blood cell count, unspecified: Secondary | ICD-10-CM | POA: Diagnosis not present

## 2023-10-16 NOTE — Telephone Encounter (Signed)
 ONO result from 10/11/23 received and placed on Megan NP's desk for review.

## 2023-10-19 NOTE — Telephone Encounter (Signed)
 I called pt and let him know that ONO normal.  No further changes at this time per Dr. Buck and MM/NP.  Pt verbalized understanding.

## 2023-10-19 NOTE — Telephone Encounter (Signed)
 Reviewed OB note.  Which was normal.  Discussed with Dr. Buck.  She agreed.  No further changes at this time

## 2023-10-19 NOTE — Telephone Encounter (Signed)
-----   Message from Duwaine Russell, NP sent at 10/19/2023  2:53 PM EDT -----  Please make patient aware of ONO results.

## 2023-10-23 DIAGNOSIS — E538 Deficiency of other specified B group vitamins: Secondary | ICD-10-CM | POA: Diagnosis not present

## 2023-10-23 DIAGNOSIS — E559 Vitamin D deficiency, unspecified: Secondary | ICD-10-CM | POA: Diagnosis not present

## 2023-10-23 DIAGNOSIS — D84821 Immunodeficiency due to drugs: Secondary | ICD-10-CM | POA: Diagnosis not present

## 2023-10-23 DIAGNOSIS — E291 Testicular hypofunction: Secondary | ICD-10-CM | POA: Diagnosis not present

## 2023-10-30 DIAGNOSIS — Z0000 Encounter for general adult medical examination without abnormal findings: Secondary | ICD-10-CM | POA: Diagnosis not present

## 2023-10-30 DIAGNOSIS — Z1331 Encounter for screening for depression: Secondary | ICD-10-CM | POA: Diagnosis not present

## 2023-10-30 DIAGNOSIS — E538 Deficiency of other specified B group vitamins: Secondary | ICD-10-CM | POA: Diagnosis not present

## 2023-10-30 DIAGNOSIS — Z23 Encounter for immunization: Secondary | ICD-10-CM | POA: Diagnosis not present

## 2023-10-30 DIAGNOSIS — Z1339 Encounter for screening examination for other mental health and behavioral disorders: Secondary | ICD-10-CM | POA: Diagnosis not present

## 2023-11-02 DIAGNOSIS — C44629 Squamous cell carcinoma of skin of left upper limb, including shoulder: Secondary | ICD-10-CM | POA: Diagnosis not present

## 2023-11-02 DIAGNOSIS — L988 Other specified disorders of the skin and subcutaneous tissue: Secondary | ICD-10-CM | POA: Diagnosis not present

## 2023-11-03 DIAGNOSIS — Z23 Encounter for immunization: Secondary | ICD-10-CM | POA: Diagnosis not present

## 2023-11-21 DIAGNOSIS — Z85828 Personal history of other malignant neoplasm of skin: Secondary | ICD-10-CM | POA: Diagnosis not present

## 2023-11-21 DIAGNOSIS — C44722 Squamous cell carcinoma of skin of right lower limb, including hip: Secondary | ICD-10-CM | POA: Diagnosis not present

## 2023-11-21 DIAGNOSIS — L821 Other seborrheic keratosis: Secondary | ICD-10-CM | POA: Diagnosis not present

## 2023-11-21 DIAGNOSIS — D044 Carcinoma in situ of skin of scalp and neck: Secondary | ICD-10-CM | POA: Diagnosis not present

## 2023-11-21 DIAGNOSIS — L570 Actinic keratosis: Secondary | ICD-10-CM | POA: Diagnosis not present

## 2023-11-29 ENCOUNTER — Encounter: Payer: Self-pay | Admitting: Adult Health

## 2023-12-12 ENCOUNTER — Ambulatory Visit (INDEPENDENT_AMBULATORY_CARE_PROVIDER_SITE_OTHER): Admitting: Adult Health

## 2023-12-12 ENCOUNTER — Encounter: Payer: Self-pay | Admitting: Adult Health

## 2023-12-12 VITALS — BP 138/70 | HR 67 | Ht 75.0 in | Wt 232.0 lb

## 2023-12-12 DIAGNOSIS — G4733 Obstructive sleep apnea (adult) (pediatric): Secondary | ICD-10-CM

## 2023-12-12 NOTE — Patient Instructions (Signed)
 Continue using CPAP nightly and greater than 4 hours each night Mask refitting ordered If your symptoms worsen or you develop new symptoms please let us know.

## 2023-12-12 NOTE — Progress Notes (Signed)
 PATIENT: Scott [name] DOB: October 19, 1955  REASON FOR VISIT: follow up HISTORY FROM: patient  Chief Complaint  Patient presents with   Sleep Apnea    Rm 4 alone  Pt is well and stable, reports no OSA/CPAP concerns.      HISTORY OF PRESENT ILLNESS: Today 12/12/23:  Scott [name] is a 68 y.o. male with a history of OSA on CPAP. Returns today for follow-up. Patient received a new CPAP. Reports that its working well. Currently has the nasal pillows but has noticed he has been opening his mouth more while sleeping. Tried the chin strap but not helpful. Returns today for follow-up.    06/15/23: Scott [name] is a 68 y.o. male with a history of OSA on CPAP. Returns today for follow-up. No concerns with CPAP. Reports that its working well. Continues to find it beneficial. Changes out his supplies regularly. Returns today for follow-up.         06/15/22: Scott [name] is a 68 y.o. male with a history of OSA on CPAP. Returns today for follow-up.  Reports that CPAP is working well.  He does change out his supplies regularly.  He does note that the travel CPAP dries him out.  His download is below     06/09/21: Scott [name] is a 68 year old male with a history of OSA on CPAP. CPAP is working well. Denies any new issues.     06/09/20:  Scott [name] is a 68 year old male with a history of obstructive sleep apnea on CPAP.  He reports that the CPAP continues to work well for him.  He states that there are some nights he does not use it greater than 4 hours because he does not sleep more than 4 hours.  He lost his wife last June.  States that he is very lonely without her-understandably so they were married 40 years.  He joins me today for follow-up.   05/30/19: Scott [name] is a 68 year old male with a history of obstructive sleep apnea on CPAP.  His download indicates that he use his machine nightly for compliance of 100%.  He uses machine greater than 4  hours 20 out of 30 days for compliance of 67%.  Her residual AHI is 1 on 8 cm of water with EPR 3.  Leak in the 95th percentile is 12.8 L/min.  He reports that the CPAP is working well for him.  Reports that he tends to stay up too late and therefore he does not use a CPAP for very long some nights.   HISTORY (copied from Dr. Obie note) 02/28/17: I reviewed his CPAP compliance data from 01/28/2017 through 02/26/2017 which is a total of 30 days, during which time he used his home CPAP 18 days but also his travel CPAP. On the home CPAP his average usage was 5 hours and 47 minutes, residual AHI at goal at 1.1 per hour, leak acceptable with the 95th percentile at 9.3 L/m on a pressure of 8 cm with EPR of 3. A download is not possible from his travel CPAP. He is not sure if it is that at the same pressure level as his home CPAP. Of note, I provided him with a travel CPAP prescription last year in December, it was supposed to be set at the same pressure level as his regular CPAP. He is advised to double check with his DME company. He reports doing well, compliant with CPAP, no recent illness, no major changes  to medical Hx of medications, no longer on testosterone . Crohn's stable. Tries to hydrate well with water.   REVIEW OF SYSTEMS: Out of a complete 14 system review of symptoms, the patient complains only of the following symptoms, and all other reviewed systems are negative.  See FYI  ALLERGIES: No Known Allergies  HOME MEDICATIONS: Outpatient Medications Prior to Visit  Medication Sig Dispense Refill   azaTHIOprine (IMURAN) 50 MG tablet Take 200 mg by mouth daily.     Budeson-Glycopyrrol-Formoterol (BREZTRI  AEROSPHERE) 160-9-4.8 MCG/ACT AERO Inhale 2 puffs into the lungs in the morning and at bedtime. 32.1 g 6   CALCIUM PO Take 600 mg by mouth 2 (two) times daily.      Cholecalciferol (VITAMIN D3) 2000 UNITS TABS Take 2 tablets by mouth daily. Twice daily     colesevelam (WELCHOL) 625 MG tablet  Take 1,875 mg by mouth 2 (two) times daily with a meal.      Cyanocobalamin  (VITAMIN B 12) 100 MCG LOZG Place 1 tablet under the tongue daily.     Loperamide HCl (IMODIUM PO) Take 4 tablets by mouth 4 (four) times daily.     Multiple Vitamin (MULTIVITAMIN) capsule Take 1 capsule by mouth daily.     omeprazole (PRILOSEC) 20 MG capsule Take 20 mg by mouth daily.     POTASSIUM PO Take 1 tablet by mouth daily.      Probiotic Product (ALIGN) 4 MG CAPS Take 1 capsule by mouth daily.     solifenacin (VESICARE) 5 MG tablet  (Patient taking differently: daily.)     tadalafil (CIALIS) 5 MG tablet Take 5 mg by mouth daily.     No facility-administered medications prior to visit.    PAST MEDICAL HISTORY: Past Medical History:  Diagnosis Date   Crohn's disease (HCC)    OSA on CPAP 07/31/2013   Pernicious anemia     PAST SURGICAL HISTORY: Past Surgical History:  Procedure Laterality Date   BOWEL RESECTION     2    FAMILY HISTORY: Family History  Problem Relation Age of Onset   Cancer Mother    Cancer Father    Heart failure Maternal Grandmother    Cancer Maternal Grandfather    Sleep apnea Father    Cancer Paternal Grandmother    Cancer Paternal Grandfather    COPD Brother     SOCIAL HISTORY: Social History   Socioeconomic History   Marital status: Widowed    Spouse name: Andrea   Number of children: 4   Years of education: 19   Highest education level: Not on file  Occupational History    Comment: Attorney  Tobacco Use   Smoking status: Former    Current packs/day: 0.00    Types: Cigarettes    Quit date: 09/16/1978    Years since quitting: 45.2   Smokeless tobacco: Never  Substance and Sexual Activity   Alcohol  use: Not Currently    Alcohol /week: 4.0 standard drinks of alcohol     Types: 4 Glasses of wine per week   Drug use: No   Sexual activity: Not on file  Other Topics Concern   Not on file  Social History Narrative   Patient consumes 1-2 cups caffeine daily    Social Drivers of Corporate Investment Banker Strain: Not on file  Food Insecurity: Not on file  Transportation Needs: Not on file  Physical Activity: Not on file  Stress: Not on file  Social Connections: Not on file  Intimate Partner Violence:  Not on file      PHYSICAL EXAM  Vitals:   12/12/23 1125  BP: 138/70  Pulse: 67  Weight: 232 lb (105.2 kg)  Height: 6' 3 (1.905 m)    Body mass index is 29 kg/m.  Generalized: Well developed, in no acute distress  Chest: Lungs clear to auscultation bilaterally  Neurological examination  Mentation: Alert oriented to time, place, history taking. Follows all commands speech and language fluent Cranial nerve II-XII: Facial symmetry noted  DIAGNOSTIC DATA (LABS, IMAGING, TESTING) - I reviewed patient records, labs, notes, testing and imaging myself where available.  Lab Results  Component Value Date   WBC 5.0 05/24/2008   HGB 12.5 (L) 05/24/2008   HCT 35.2 (L) 05/24/2008   MCV 99.6 05/24/2008   PLT 151 05/24/2008      Component Value Date/Time   NA 136 05/24/2008 0448   K 3.4 (L) 05/24/2008 0448   CL 105 05/24/2008 0448   CO2 26 05/24/2008 0448   GLUCOSE 108 (H) 05/24/2008 0448   BUN 6 05/24/2008 0448   CREATININE 0.80 05/29/2020 1643   CALCIUM 8.0 (L) 05/24/2008 0448   PROT 6.1 05/22/2008 1555   ALBUMIN 3.4 (L) 05/22/2008 1555   AST 26 05/22/2008 1555   ALT 18 05/22/2008 1555   ALKPHOS 75 05/22/2008 1555   BILITOT 1.0 05/22/2008 1555   GFRNONAA >60 05/24/2008 0448   GFRAA  05/24/2008 0448    >60        The eGFR has been calculated using the MDRD equation. This calculation has not been validated in all clinical situations. eGFR's persistently <60 mL/min signify possible Chronic Kidney Disease.      ASSESSMENT AND PLAN 68 y.o. year old male  has a past medical history of Crohn's disease (HCC), OSA on CPAP (07/31/2013), and Pernicious anemia. here with:  OSA on CPAP  - CPAP compliance- good - Good  treatment of AHI  - Encourage patient to use CPAP nightly and > 4 hours each night - mask refitting ordered - F/U in 1 year or sooner if needed   Duwaine Russell, MSN, NP-C 12/12/2023, 12:17 PM [facility] [address], [address] [place], [place] [address] [phone]

## 2024-01-08 ENCOUNTER — Other Ambulatory Visit: Payer: Self-pay | Admitting: Pulmonary Disease

## 2024-01-08 DIAGNOSIS — J4520 Mild intermittent asthma, uncomplicated: Secondary | ICD-10-CM

## 2024-06-17 ENCOUNTER — Ambulatory Visit: Admitting: Adult Health
